# Patient Record
Sex: Female | Born: 1995 | Race: White | Hispanic: No | Marital: Single | Smoking: Current every day smoker
Health system: Southern US, Community
[De-identification: ages and names within clinical notes are randomized; demographics above are authoritative.]

## PROBLEM LIST (undated history)

## (undated) DIAGNOSIS — Z8489 Family history of other specified conditions: Secondary | ICD-10-CM

## (undated) DIAGNOSIS — J302 Other seasonal allergic rhinitis: Secondary | ICD-10-CM

## (undated) DIAGNOSIS — J45909 Unspecified asthma, uncomplicated: Secondary | ICD-10-CM

## (undated) DIAGNOSIS — R319 Hematuria, unspecified: Secondary | ICD-10-CM

## (undated) DIAGNOSIS — N200 Calculus of kidney: Secondary | ICD-10-CM

## (undated) DIAGNOSIS — N289 Disorder of kidney and ureter, unspecified: Secondary | ICD-10-CM

## (undated) DIAGNOSIS — R252 Cramp and spasm: Secondary | ICD-10-CM

## (undated) HISTORY — DX: Disorder of kidney and ureter, unspecified: N28.9

## (undated) HISTORY — PX: WISDOM TOOTH EXTRACTION: SHX21

## (undated) HISTORY — DX: Other seasonal allergic rhinitis: J30.2

## (undated) HISTORY — DX: Hematuria, unspecified: R31.9

## (undated) HISTORY — DX: Unspecified asthma, uncomplicated: J45.909

## (undated) HISTORY — DX: Cramp and spasm: R25.2

## (undated) HISTORY — DX: Calculus of kidney: N20.0

---

## 2017-06-12 ENCOUNTER — Encounter (HOSPITAL_COMMUNITY): Payer: Self-pay | Admitting: Emergency Medicine

## 2017-06-12 DIAGNOSIS — N939 Abnormal uterine and vaginal bleeding, unspecified: Secondary | ICD-10-CM | POA: Insufficient documentation

## 2017-06-12 DIAGNOSIS — F17210 Nicotine dependence, cigarettes, uncomplicated: Secondary | ICD-10-CM | POA: Insufficient documentation

## 2017-06-12 DIAGNOSIS — R102 Pelvic and perineal pain: Secondary | ICD-10-CM | POA: Insufficient documentation

## 2017-06-12 DIAGNOSIS — R109 Unspecified abdominal pain: Secondary | ICD-10-CM | POA: Diagnosis present

## 2017-06-12 LAB — CBC WITH DIFFERENTIAL/PLATELET
BASOS ABS: 0 10*3/uL (ref 0.0–0.1)
BASOS PCT: 0 %
EOS PCT: 1 %
Eosinophils Absolute: 0.1 10*3/uL (ref 0.0–0.7)
HCT: 43 % (ref 36.0–46.0)
Hemoglobin: 14.2 g/dL (ref 12.0–15.0)
LYMPHS PCT: 29 %
Lymphs Abs: 2.4 10*3/uL (ref 0.7–4.0)
MCH: 30.5 pg (ref 26.0–34.0)
MCHC: 33 g/dL (ref 30.0–36.0)
MCV: 92.3 fL (ref 78.0–100.0)
MONO ABS: 0.6 10*3/uL (ref 0.1–1.0)
MONOS PCT: 7 %
Neutro Abs: 5.2 10*3/uL (ref 1.7–7.7)
Neutrophils Relative %: 63 %
Platelets: 263 10*3/uL (ref 150–400)
RBC: 4.66 MIL/uL (ref 3.87–5.11)
RDW: 12.7 % (ref 11.5–15.5)
WBC: 8.3 10*3/uL (ref 4.0–10.5)

## 2017-06-12 LAB — URINALYSIS, ROUTINE W REFLEX MICROSCOPIC
BILIRUBIN URINE: NEGATIVE
Glucose, UA: NEGATIVE mg/dL
KETONES UR: NEGATIVE mg/dL
LEUKOCYTES UA: NEGATIVE
Nitrite: NEGATIVE
Protein, ur: NEGATIVE mg/dL
Specific Gravity, Urine: 1.008 (ref 1.005–1.030)
pH: 5 (ref 5.0–8.0)

## 2017-06-12 LAB — I-STAT BETA HCG BLOOD, ED (MC, WL, AP ONLY)

## 2017-06-12 LAB — BASIC METABOLIC PANEL
ANION GAP: 8 (ref 5–15)
BUN: 15 mg/dL (ref 6–20)
CALCIUM: 9.7 mg/dL (ref 8.9–10.3)
CO2: 22 mmol/L (ref 22–32)
CREATININE: 0.93 mg/dL (ref 0.44–1.00)
Chloride: 108 mmol/L (ref 101–111)
GFR calc Af Amer: >60 mL/min (ref >60)
GFR calc non Af Amer: >60 mL/min (ref >60)
GLUCOSE: 85 mg/dL (ref 65–99)
Potassium: 4 mmol/L (ref 3.5–5.1)
Sodium: 138 mmol/L (ref 135–145)

## 2017-06-12 NOTE — ED Triage Notes (Signed)
Pt reports two days of R sided flank pain with increasing pain over the last several days. Reports today that she started bleeding - dark blood, reports more than a period.

## 2017-06-13 ENCOUNTER — Emergency Department (HOSPITAL_COMMUNITY)

## 2017-06-13 ENCOUNTER — Emergency Department (HOSPITAL_COMMUNITY)
Admission: EM | Admit: 2017-06-13 | Discharge: 2017-06-13 | Disposition: A | Discharge status: Home / Self Care | Attending: Emergency Medicine | Admitting: Emergency Medicine

## 2017-06-13 DIAGNOSIS — R102 Pelvic and perineal pain: Secondary | ICD-10-CM

## 2017-06-13 DIAGNOSIS — N939 Abnormal uterine and vaginal bleeding, unspecified: Secondary | ICD-10-CM

## 2017-06-13 LAB — GC/CHLAMYDIA PROBE AMP (~~LOC~~) NOT AT ARMC
Chlamydia: NEGATIVE
NEISSERIA GONORRHEA: NEGATIVE

## 2017-06-13 LAB — WET PREP, GENITAL
CLUE CELLS WET PREP: NONE SEEN
Sperm: NONE SEEN
TRICH WET PREP: NONE SEEN
WBC, Wet Prep HPF POC: NONE SEEN
YEAST WET PREP: NONE SEEN

## 2017-06-13 MED ORDER — OXYCODONE-ACETAMINOPHEN 5-325 MG PO TABS
1.0000 | ORAL_TABLET | Freq: Once | ORAL | Status: AC
  Administered 2017-06-13: 1 via ORAL
  Filled 2017-06-13: qty 1

## 2017-06-13 NOTE — Discharge Instructions (Signed)
Recommend tylenol/motrin for pain. Follow-up with your OB-GYN on Feb 6th.  May be seen sooner if any new/acute changes.   Can also discuss with women's clinic if you feel you need sooner evaluation. Return to the ED for new or worsening symptoms.

## 2017-06-13 NOTE — ED Provider Notes (Signed)
[facility] EMERGENCY DEPARTMENT Provider Note   CSN: [id number] Arrival date & time: 06/12/17  2153     History   Chief Complaint Chief Complaint  Patient presents with  . Flank Pain  . Vaginal Bleeding    HPI Megan Brady is a 22 y.o. female.  The history is provided by the patient and medical records.  Flank Pain   Vaginal Bleeding  Primary symptoms include vaginal bleeding.    22 year old female here with right flank and pelvic pain.  States is been ongoing for about 24 hours but she began having abnormal, heavy vaginal bleeding this evening around 9:45 PM.  States bleeding was very heavy with passage of clots.  Reports she has had worsening lower abdominal cramping since this began.  She denies any fever or chills.  No nausea or vomiting.  States she did notice a small amount of discharge when she provided her urine sample in the waiting room.  She is currently sexually active with one female partner, denies any concern for STD at this time.  She has no GYN history.  She gets regular GYN exam exams, last in April 2018 which was normal.  States she did have history of heavy/irregular periods when she was younger, once being placed on Depo shots she usually no longer has a period.  Recently moved to [facility], seeing new OB-GYN on 06/28/17.  History reviewed. No pertinent past medical history.  There are no active problems to display for this patient.   Past Surgical History:  Procedure Laterality Date  . WISDOM TOOTH EXTRACTION      OB History    No data available       Home Medications    Prior to Admission medications   Not on File    Family History No family history on file.  Social History Social History   Tobacco Use  . Smoking status: Current Every Day Smoker    Packs/day: 0.50    Types: Cigarettes  . Smokeless tobacco: Never Used  Substance Use Topics  . Alcohol use: Yes    Frequency: Never  . Drug use: No     Allergies     Patient has no known allergies.   Review of Systems Review of Systems  Genitourinary: Positive for flank pain and vaginal bleeding.  All other systems reviewed and are negative.    Physical Exam Updated Vital Signs BP (!) 124/105 (BP Location: Right Arm)   Pulse (!) 106   Temp 98.5 F (36.9 C) (Oral)   Resp 20   Ht 5\' 5"  (1.651 m)   Wt 64.9 kg (143 lb)   LMP 03/23/2017 (Approximate)   SpO2 100%   BMI 23.80 kg/m   Physical Exam  Constitutional: She is oriented to person, place, and time. She appears well-developed and well-nourished.  HENT:  Head: Normocephalic and atraumatic.  Mouth/Throat: Oropharynx is clear and moist.  Eyes: Conjunctivae and EOM are normal. Pupils are equal, round, and reactive to light.  Neck: Normal range of motion.  Cardiovascular: Normal rate, regular rhythm and normal heart sounds.  Pulmonary/Chest: Effort normal and breath sounds normal. No stridor. No respiratory distress.  Abdominal: Soft. Bowel sounds are normal. There is no tenderness. There is no rebound and no CVA tenderness.    Genitourinary:  Genitourinary Comments: Exam chaperoned by NT Normal female external genitalia without visible lesions or rash; mild amount of vaginal bleeding; cervical os closed; no appreciable discharge; right adnexal tenderness; no CMT or friability;  no left adnexal tenderness  Musculoskeletal: Normal range of motion.  Neurological: She is alert and oriented to person, place, and time.  Skin: Skin is warm and dry.  Psychiatric: She has a normal mood and affect.  Nursing note and vitals reviewed.    ED Treatments / Results  Labs (all labs ordered are listed, but only abnormal results are displayed) Labs Reviewed  URINALYSIS, ROUTINE W REFLEX MICROSCOPIC - Abnormal; Notable for the following components:      Result Value   Color, Urine STRAW (*)    Hgb urine dipstick MODERATE (*)    Bacteria, UA RARE (*)    Squamous Epithelial / LPF 0-5 (*)    All  other components within normal limits  WET PREP, GENITAL  CBC WITH DIFFERENTIAL/PLATELET  BASIC METABOLIC PANEL  I-STAT BETA HCG BLOOD, ED ([facility], [facility], [facility] ONLY)  GC/CHLAMYDIA PROBE AMP ([place]) NOT AT [facility]    EKG  EKG Interpretation None       Radiology [place] Pelvic Doppler (torsion R/o Or Mass Arterial Flow)  Result Date: 06/13/2017 CLINICAL DATA:  Pelvic pain for 2 days, greater on the right. EXAM: TRANSABDOMINAL AND TRANSVAGINAL ULTRASOUND OF PELVIS DOPPLER ULTRASOUND OF OVARIES TECHNIQUE: Both transabdominal and transvaginal ultrasound examinations of the pelvis were performed. Transabdominal technique was performed for global imaging of the pelvis including uterus, ovaries, adnexal regions, and pelvic cul-de-sac. It was necessary to proceed with endovaginal exam following the transabdominal exam to visualize the uterus and ovaries. Color and duplex Doppler ultrasound was utilized to evaluate blood flow to the ovaries. COMPARISON:  None. FINDINGS: Uterus Measurements: 6.6 x 3.6 x 4 cm. Uterus is retroflexed. No fibroids or other mass visualized. Endometrium Thickness: 14 mm.  No focal abnormality visualized. Right ovary Measurements: 3.9 x 1.6 x 2.2 cm. Normal appearance/no adnexal mass. Left ovary Measurements: 3.5 x 1.8 x 2.2 cm. Normal appearance/no adnexal mass. Pulsed Doppler evaluation of both ovaries demonstrates normal low-resistance arterial and venous waveforms. Other findings There is a small amount of free fluid in the pelvis, likely physiologic. IMPRESSION: Normal ultrasound appearance of the uterus and ovaries. Small amount of free fluid in the pelvis, likely physiologic. Electronically Signed   By: Burman NievesWilliam  Stevens M.D.   On: 06/13/2017 02:22   [place] Pelvic Complete W Transvaginal And Torsion R/o  Result Date: 06/13/2017 CLINICAL DATA:  Pelvic pain for 2 days, greater on the right. EXAM: TRANSABDOMINAL AND TRANSVAGINAL ULTRASOUND OF PELVIS DOPPLER ULTRASOUND OF OVARIES TECHNIQUE:  Both transabdominal and transvaginal ultrasound examinations of the pelvis were performed. Transabdominal technique was performed for global imaging of the pelvis including uterus, ovaries, adnexal regions, and pelvic cul-de-sac. It was necessary to proceed with endovaginal exam following the transabdominal exam to visualize the uterus and ovaries. Color and duplex Doppler ultrasound was utilized to evaluate blood flow to the ovaries. COMPARISON:  None. FINDINGS: Uterus Measurements: 6.6 x 3.6 x 4 cm. Uterus is retroflexed. No fibroids or other mass visualized. Endometrium Thickness: 14 mm.  No focal abnormality visualized. Right ovary Measurements: 3.9 x 1.6 x 2.2 cm. Normal appearance/no adnexal mass. Left ovary Measurements: 3.5 x 1.8 x 2.2 cm. Normal appearance/no adnexal mass. Pulsed Doppler evaluation of both ovaries demonstrates normal low-resistance arterial and venous waveforms. Other findings There is a small amount of free fluid in the pelvis, likely physiologic. IMPRESSION: Normal ultrasound appearance of the uterus and ovaries. Small amount of free fluid in the pelvis, likely physiologic. Electronically Signed   By: Marisa CyphersWilliam  Stevens M.D.  On: 06/13/2017 02:22    Procedures Procedures (including critical care time)  Medications Ordered in ED Medications  oxyCODONE-acetaminophen (PERCOCET/ROXICET) 5-325 MG per tablet 1 tablet (1 tablet Oral Given 06/13/17 0128)     Initial Impression / Assessment and Plan / ED Course  I have reviewed the triage vital signs and the nursing notes.  Pertinent labs & imaging results that were available during my care of the patient were reviewed by me and considered in my medical decision making (see chart for details).  22 year old female here with right flank pain and heavy vaginal bleeding.  Currently on Depo-Provera injections and does not generally have a cycle.  States pain in right flank/pelvis.  No urinary symptoms or hematuria.  Did notice some  discharge when she arrived to the ED.  Denies concern for STD.  She is afebrile and nontoxic.  Some mild tenderness in the right pelvis. No focal CVA tenderness.  No hx of kidney stones. Screening labs overall reassuring.  UA with blood noted, however suspect this is vaginal given her presenting symptoms.  No signs of infection.  Pelvic performed, right adnexal tenderness noted.  No cervical motion tenderness or friability discussed with.  No appreciable discharge.  Will obtain pelvic ultrasound.  Pelvic ultrasound without acute findings.  Small amount of free fluid in the pelvis which is likely physiologic.  There are no masses or cysts.  Patient reports her bleeding has slowed significantly and her pain is improving, does still have some mild cramping.  We will have her follow-up closely with OB/GYN--has an appointment on 06/28/2017, she understands to follow-up sooner if any new or worsening symptoms.  Patient was discharged home in stable condition.  Final Clinical Impressions(s) / ED Diagnoses   Final diagnoses:  Pelvic pain  Vaginal bleeding    ED Discharge Orders    None       Garlon Hatchet, PA-C 06/13/17 0600    Horton, Mayer Masker, MD 06/13/17 [phone]

## 2017-06-13 NOTE — ED Notes (Signed)
Pt verbalizes understanding of d/c instructions. Pt ambulatory at d/c with all belongings.   

## 2017-06-13 NOTE — ED Notes (Signed)
Patient transported to Ultrasound 

## 2017-06-13 NOTE — ED Notes (Signed)
ED Provider at bedside. 

## 2017-08-23 ENCOUNTER — Encounter: Payer: Self-pay | Admitting: Primary Care

## 2017-08-23 ENCOUNTER — Ambulatory Visit (INDEPENDENT_AMBULATORY_CARE_PROVIDER_SITE_OTHER): Admitting: Primary Care

## 2017-08-23 VITALS — BP 122/78 | HR 90 | Temp 97.8°F | Ht 65.25 in | Wt 147.0 lb

## 2017-08-23 DIAGNOSIS — J4520 Mild intermittent asthma, uncomplicated: Secondary | ICD-10-CM | POA: Diagnosis not present

## 2017-08-23 DIAGNOSIS — J069 Acute upper respiratory infection, unspecified: Secondary | ICD-10-CM | POA: Diagnosis not present

## 2017-08-23 DIAGNOSIS — R311 Benign essential microscopic hematuria: Secondary | ICD-10-CM | POA: Diagnosis not present

## 2017-08-23 DIAGNOSIS — R319 Hematuria, unspecified: Secondary | ICD-10-CM | POA: Insufficient documentation

## 2017-08-23 MED ORDER — BENZONATATE 200 MG PO CAPS: 200.0000 mg | ORAL_CAPSULE | Freq: Three times a day (TID) | ORAL | 0 refills | Status: DC | PRN

## 2017-08-23 MED ORDER — ALBUTEROL SULFATE HFA 108 (90 BASE) MCG/ACT IN AERS: 2.0000 | INHALATION_SPRAY | Freq: Four times a day (QID) | RESPIRATORY_TRACT | 0 refills | Status: DC | PRN

## 2017-08-23 NOTE — Patient Instructions (Signed)
Your symptoms are representative of a viral illness which will resolve on its own over time. Our goal is to treat your symptoms in order to aid your body in the healing process and to make you more comfortable.   Continue taking the allergy medication.  You may take Benzonatate capsules for cough. Take 1 capsule by mouth three times daily as needed for cough.  Make sure to stay hydrated and rest.  Shortness of Breath/Wheezing/Cough: Use the albuterol inhaler. Inhale 2 puffs into the lungs every four to six hours as needed for wheezing, cough, and/or shortness of breath.   It was a pleasure to meet you today! Please don't hesitate to call or message me with any questions. Welcome to [facility]!

## 2017-08-23 NOTE — Assessment & Plan Note (Addendum)
Microscopic. Since childhood, evaluated by Urology in [place] and determined to be benign. Will obtain records.

## 2017-08-23 NOTE — Progress Notes (Signed)
Subjective:    Patient ID: Megan Brady, female    DOB: 12/06/1995, 22 y.o.   MRN: [id number]  HPI  Megan Brady is a 22 year old female who presents today to establish care and discuss the problems mentioned below. Will obtain old records.  1) Cough: She also reports sore throat, sinus pressure, rhinorrhea, watery eyes, post nasal drip. Her symptoms began 3-4 days ago. She's been taking an antihistamine and a "cold and flu" cough suppressant without much improvement. She denies fevers. She has been exposed to several children at work that have been sick. She did not get a flu shot this past year.   2) Form Completion: She has a form that needs to be completed for her current occupation as a Hydrographic surveyordaycare instructor.   3) Asthma: Diagnosed in childhood. She mostly doesn't experience shortness of breath or wheezing unless she's very physically active. She does not have an albuterol inhaler now, last use was in high school.   4) Kidney Stones: History of stones x 3 episodes in the past. She'll form stones if her sodium levels are quite high. Her last episode of renal stones was two years ago. 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 She was [place] in [place].   Review of Systems  Constitutional: Negative for fever.  HENT: Positive for congestion, rhinorrhea, sinus pressure and sore throat.   Respiratory: Positive for cough. Negative for shortness of breath.   Cardiovascular: Negative for chest pain.  Genitourinary: Negative for flank pain and hematuria.  Allergic/Immunologic: Positive for environmental allergies.       Past Medical History:  Diagnosis Date  . Asthma   . Hematuria   . Kidney stones   . Seasonal allergies      Social History   Socioeconomic History  . Marital status: Single    Spouse name: Not on file  . Number of children: Not on file  . Years of education: Not on file  . Highest education  level: Not on file  Occupational History  . Not on file  Social Needs  . Financial resource strain: Not on file  . Food insecurity:    Worry: Not on file    Inability: Not on file  . Transportation needs:    Medical: Not on file    Non-medical: Not on file  Tobacco Use  . Smoking status: Current Every Day Smoker    Packs/day: 0.50    Types: Cigarettes  . Smokeless tobacco: Never Used  Substance and Sexual Activity  . Alcohol use: Yes    Frequency: Never  . Drug use: No  . Sexual activity: Not on file  Lifestyle  . Physical activity:    Days per week: Not on file    Minutes per session: Not on file  . Stress: Not on file  Relationships  . Social connections:    Talks on phone: Not on file    Gets together: Not on file    Attends religious service: Not on file    Active member of club or organization: Not on file    Attends meetings of clubs or organizations: Not on file    Relationship status: Not on file  . Intimate partner violence:    Fear of current or ex partner: Not on file    Emotionally abused: Not on file    Physically abused: Not on file    Forced sexual activity: Not on file  Other Topics Concern  . Not on file  Social History Narrative   Married.   No children.   Works in Furniture conservator/restorer.   Enjoys reading, spending time outdoors.     Past Surgical History:  Procedure Laterality Date  . WISDOM TOOTH EXTRACTION      Family History  Problem Relation Age of Onset  . Hypertension Mother   . Hypothyroidism Mother   . Lung cancer Maternal Grandmother   . Lung cancer Maternal Aunt     No Known Allergies  No current outpatient medications on file prior to visit.   No current facility-administered medications on file prior to visit.     BP 122/78   Pulse 90   Temp 97.8 F (36.6 C) (Oral)   Ht 5' 5.25" (1.657 m)   Wt 147 lb (66.7 kg)   SpO2 99%   BMI 24.27 kg/m    Objective:   Physical Exam  Constitutional: She appears well-nourished.  HENT:   Right Ear: Tympanic membrane and ear canal normal.  Left Ear: Tympanic membrane and ear canal normal.  Nose: Right sinus exhibits no maxillary sinus tenderness and no frontal sinus tenderness. Left sinus exhibits no maxillary sinus tenderness and no frontal sinus tenderness.  Mouth/Throat: Oropharynx is clear and moist.  Eyes: Conjunctivae are normal.  Neck: Neck supple.  Cardiovascular: Normal rate and regular rhythm.  Pulmonary/Chest: Effort normal and breath sounds normal. She has no wheezes. She has no rales.  Lymphadenopathy:    She has no cervical adenopathy.  Skin: Skin is warm and dry.          Assessment & Plan:  URI:  Cough, rhinorrhea, sore throat, PND x 3 days. Exam today overall unremarkable, vitals WNL. Suspect viral vs allergy etiology, will treat with conservative measures. Rx for Jerilynn Som sent to pharmacy. Continue antihistamine. Fluids, rest, follow up PRN.  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 Patient is fit mentally and physically to proceed with employment.  Form signed.  Doreene Nest, NP

## 2017-08-23 NOTE — Assessment & Plan Note (Signed)
No recent asthma symptoms. Rx for albuterol inhaler sent to pharmacy to use PRN.

## 2017-12-04 ENCOUNTER — Ambulatory Visit (INDEPENDENT_AMBULATORY_CARE_PROVIDER_SITE_OTHER): Admitting: Primary Care

## 2017-12-04 ENCOUNTER — Encounter: Payer: Self-pay | Admitting: Primary Care

## 2017-12-04 VITALS — BP 122/74 | HR 103 | Temp 98.1°F | Ht 65.25 in | Wt 150.8 lb

## 2017-12-04 DIAGNOSIS — R197 Diarrhea, unspecified: Secondary | ICD-10-CM

## 2017-12-04 DIAGNOSIS — R1011 Right upper quadrant pain: Secondary | ICD-10-CM | POA: Diagnosis not present

## 2017-12-04 DIAGNOSIS — R109 Unspecified abdominal pain: Secondary | ICD-10-CM | POA: Diagnosis not present

## 2017-12-04 DIAGNOSIS — R110 Nausea: Secondary | ICD-10-CM

## 2017-12-04 NOTE — Patient Instructions (Signed)
Stop by the lab prior to leaving today. I will notify you of your results once received.   You will be contacted regarding your ultrasound  Please let us know if you have not been contacted within one week.   Start keeping a food journal of your symptoms. Take a look at the information below.  It was a pleasure to see you today!  Diet for Irritable Bowel Syndrome When you have irritable bowel syndrome (IBS), the foods you eat and your eating habits are very important. IBS may cause various symptoms, such as abdominal pain, constipation, or diarrhea. Choosing the right foods can help ease discomfort caused by these symptoms. Work with your health care provider and dietitian to find the best eating plan to help control your symptoms. What general guidelines do I need to follow?  Keep a food diary. This will help you identify foods that cause symptoms. Write down: ? What you eat and when. ? What symptoms you have. ? When symptoms occur in relation to your meals.  Avoid foods that cause symptoms. Talk with your dietitian about other ways to get the same nutrients that are in these foods.  Eat more foods that contain fiber. Take a fiber supplement if directed by your dietitian.  Eat your meals slowly, in a relaxed setting.  Aim to eat 5-6 small meals per day. Do not skip meals.  Drink enough fluids to keep your urine clear or pale yellow.  Ask your health care provider if you should take an over-the-counter probiotic during flare-ups to help restore healthy gut bacteria.  If you have cramping or diarrhea, try making your meals low in fat and high in carbohydrates. Examples of carbohydrates are pasta, rice, whole grain breads and cereals, fruits, and vegetables.  If dairy products cause your symptoms to flare up, try eating less of them. You might be able to handle yogurt better than other dairy products because it contains bacteria that help with digestion. What foods are not  recommended? The following are some foods and drinks that may worsen your symptoms:  Fatty foods, such as Jamaica[place] fries.  Milk products, such as cheese or ice cream.  Chocolate.  Alcohol.  Products with caffeine, such as coffee.  Carbonated drinks, such as soda.  The items listed above may not be a complete list of foods and beverages to avoid. Contact your dietitian for more information. What foods are good sources of fiber? Your health care provider or dietitian may recommend that you eat more foods that contain fiber. Fiber can help reduce constipation and other IBS symptoms. Add foods with fiber to your diet a little at a time so that your body can get used to them. Too much fiber at once might cause gas and swelling of your abdomen. The following are some foods that are good sources of fiber:  Apples.  Peaches.  Pears.  Berries.  Figs.  Broccoli (raw).  Cabbage.  Carrots.  Raw peas.  Kidney beans.  Lima beans.  Whole grain bread.  Whole grain cereal.  Where to find more information: [employer] for Functional Gastrointestinal Disorders: www.iffgd.[employer] National Institute of Diabetes and Digestive and Kidney Diseases: http://norris-lawson.com/www.niddk.nih.gov/health-information/health-topics/digestive-diseases/ibs/Pages/facts.aspx This information is not intended to replace advice given to you by your health care provider. Make sure you discuss any questions you have with your health care provider. Document Released: 07/30/2003 Document Revised: 10/15/2015 Document Reviewed: 08/09/2013 Elsevier Interactive Patient Education  2018 [employer]

## 2017-12-04 NOTE — Assessment & Plan Note (Signed)
Daily, most noticeable just after meals. Associated symptoms concerning for gall bladder involvement, IBS-Diarrhea type.  Given during of symptoms, location of symptoms, duration of symptoms, less likely differentials include renal stone, colitis.   Check CBC, CMP today. Check Ultrasound of RUQ to start. Consider renal ultrasound vs HIDA scan if imaging and labs inconclusive.   Consider dicyclomine for IBS treatment.

## 2017-12-04 NOTE — Progress Notes (Signed)
Subjective:    Patient ID: Megan Brady, female    DOB: March 16, 1996, 22 y.o.   MRN: [id number]  HPI  [name] is a 22 year old female with a history of kidney stone who presents today with a chief complaint of flank pain.  Her pain is located to the right flank with radiation to her lateral/right upper abdominal quadrant, sometimes to her right upper thoracic back. Her pain has been intermittent for the last 6 months, with daily symptoms of abdominal cramping, diarrhea, nausea, bloating.  Other symptoms include generalized abdominal cramping, diarrhea, nausea, abdominal bloating (without vomiting) that occurs mostly everyday when eating. She denies constipation, rectal bleeding, unexplained weight loss, vaginal symptoms, dysuria, urinary frequency, right lower quadrant pain, vomiting. She does experience diarrhea twice daily on average.   This does not feel like a kidney stone, her last kidney stone was in 2018. She makes sure to follow a specific diet to prevent kidney stones. Her mother had her gallbladder removed and thinks her daughter may have similar problems.   Review of Systems  Constitutional: Negative for fever.  Respiratory: Negative for shortness of breath.   Gastrointestinal: Positive for abdominal pain, diarrhea and nausea. Negative for blood in stool, constipation and vomiting.  Genitourinary: Positive for flank pain. Negative for dysuria, frequency, hematuria, pelvic pain and vaginal discharge.  Skin: Negative for color change.       Past Medical History:  Diagnosis Date  . Asthma   . Hematuria   . Kidney stones   . Seasonal allergies      Social History   Socioeconomic History  . Marital status: Single    Spouse name: Not on file  . Number of children: Not on file  . Years of education: Not on file  . Highest education level: Not on file  Occupational History  . Not on file  Social Needs  . Financial resource strain: Not on file  . Food insecurity:     Worry: Not on file    Inability: Not on file  . Transportation needs:    Medical: Not on file    Non-medical: Not on file  Tobacco Use  . Smoking status: Current Every Day Smoker    Packs/day: 0.50    Types: Cigarettes  . Smokeless tobacco: Never Used  Substance and Sexual Activity  . Alcohol use: Yes    Frequency: Never  . Drug use: No  . Sexual activity: Not on file  Lifestyle  . Physical activity:    Days per week: Not on file    Minutes per session: Not on file  . Stress: Not on file  Relationships  . Social connections:    Talks on phone: Not on file    Gets together: Not on file    Attends religious service: Not on file    Active member of club or organization: Not on file    Attends meetings of clubs or organizations: Not on file    Relationship status: Not on file  . Intimate partner violence:    Fear of current or ex partner: Not on file    Emotionally abused: Not on file    Physically abused: Not on file    Forced sexual activity: Not on file  Other Topics Concern  . Not on file  Social History Narrative   Married.   No children.   Works in Furniture conservator/restorer.   Enjoys reading, spending time outdoors.     Past Surgical History:  Procedure  Laterality Date  . WISDOM TOOTH EXTRACTION      Family History  Problem Relation Age of Onset  . Hypertension Mother   . Hypothyroidism Mother   . Lung cancer Maternal Grandmother   . Lung cancer Maternal Aunt     No Known Allergies  Current Outpatient Medications on File Prior to Visit  Medication Sig Dispense Refill  . albuterol (PROVENTIL HFA;VENTOLIN HFA) 108 (90 Base) MCG/ACT inhaler Inhale 2 puffs into the lungs every 6 (six) hours as needed. 1 Inhaler 0  . ondansetron (ZOFRAN-ODT) 4 MG disintegrating tablet Take 4 mg by mouth every 8 (eight) hours as needed.     No current facility-administered medications on file prior to visit.     BP 122/74   Pulse (!) 103   Temp 98.1 F (36.7 C) (Oral)   Ht 5'  5.25" (1.657 m)   Wt 150 lb 12 oz (68.4 kg)   SpO2 98%   BMI 24.89 kg/m    Objective:   Physical Exam  Constitutional: She appears well-nourished.  Neck: Neck supple.  Cardiovascular: Normal rate and regular rhythm.  Respiratory: Breath sounds normal.  GI: Soft. Normal appearance and bowel sounds are normal. There is no tenderness. There is no guarding, no CVA tenderness, no tenderness at McBurney's point and negative Murphy's sign.  Skin: Skin is warm and dry.           Assessment & Plan:

## 2017-12-05 ENCOUNTER — Telehealth: Payer: Self-pay

## 2017-12-05 LAB — COMPREHENSIVE METABOLIC PANEL
ALBUMIN: 4.5 g/dL (ref 3.5–5.2)
ALT: 25 U/L (ref 0–35)
AST: 20 U/L (ref 0–37)
Alkaline Phosphatase: 59 U/L (ref 39–117)
BUN: 10 mg/dL (ref 6–23)
CHLORIDE: 108 meq/L (ref 96–112)
CO2: 28 mEq/L (ref 19–32)
CREATININE: 1.08 mg/dL (ref 0.40–1.20)
Calcium: 9.9 mg/dL (ref 8.4–10.5)
GFR: 67.69 mL/min (ref >60.00)
GLUCOSE: 97 mg/dL (ref 70–99)
POTASSIUM: 4.9 meq/L (ref 3.5–5.1)
SODIUM: 142 meq/L (ref 135–145)
Total Bilirubin: 0.8 mg/dL (ref 0.2–1.2)
Total Protein: 7.4 g/dL (ref 6.0–8.3)

## 2017-12-05 LAB — CBC
HEMATOCRIT: 41.2 % (ref 36.0–46.0)
Hemoglobin: 14.2 g/dL (ref 12.0–15.0)
MCHC: 34.5 g/dL (ref 30.0–36.0)
MCV: 91 fl (ref 78.0–100.0)
Platelets: 258 10*3/uL (ref 150.0–400.0)
RBC: 4.53 Mil/uL (ref 3.87–5.11)
RDW: 12.2 % (ref 11.5–15.5)
WBC: 6.3 10*3/uL (ref 4.0–10.5)

## 2017-12-05 NOTE — Telephone Encounter (Signed)
Left message for patient to call Deavon Podgorski back in regards to an ultrasound appointment-Lamberto Dinapoli V Iyari Hagner, RMA

## 2018-04-23 ENCOUNTER — Ambulatory Visit
Admission: RE | Admit: 2018-04-23 | Discharge: 2018-04-23 | Disposition: A | Discharge status: Home / Self Care | Source: Ambulatory Visit | Attending: Primary Care | Admitting: Primary Care

## 2018-04-23 DIAGNOSIS — K838 Other specified diseases of biliary tract: Secondary | ICD-10-CM | POA: Diagnosis not present

## 2018-04-23 DIAGNOSIS — R197 Diarrhea, unspecified: Secondary | ICD-10-CM | POA: Insufficient documentation

## 2018-04-23 DIAGNOSIS — R110 Nausea: Secondary | ICD-10-CM

## 2018-04-23 DIAGNOSIS — R1011 Right upper quadrant pain: Secondary | ICD-10-CM | POA: Diagnosis not present

## 2018-04-23 DIAGNOSIS — K8020 Calculus of gallbladder without cholecystitis without obstruction: Secondary | ICD-10-CM | POA: Insufficient documentation

## 2018-04-26 ENCOUNTER — Telehealth: Payer: Self-pay | Admitting: *Deleted

## 2018-04-26 NOTE — Telephone Encounter (Signed)
Please call pt [phone]

## 2018-04-26 NOTE — Telephone Encounter (Signed)
-----   Message from Doreene NestKatherine K Clark, NP sent at 04/23/2018  1:43 PM EST ----- Please notify patient:  The ultrasound of her abdomen shows gallstones without inflammation to the gallbladder. If she still experiencing symptoms of abdominal pain, diarrhea, nausea, bloating?  If so then it would be reasonable to get her to the lab to repeat her liver enzymes. Let me know.

## 2018-04-26 NOTE — Telephone Encounter (Signed)
Pt returning call for results

## 2018-04-26 NOTE — Telephone Encounter (Signed)
Message left for patient to return my call.  

## 2018-04-27 ENCOUNTER — Other Ambulatory Visit: Payer: Self-pay | Admitting: Primary Care

## 2018-04-27 ENCOUNTER — Other Ambulatory Visit (INDEPENDENT_AMBULATORY_CARE_PROVIDER_SITE_OTHER)

## 2018-04-27 DIAGNOSIS — R109 Unspecified abdominal pain: Secondary | ICD-10-CM

## 2018-04-27 NOTE — Telephone Encounter (Signed)
Spoken and notified patient of Graylon GunningKate Clark's comments. Patient verbalized understanding.  Addressed in result note

## 2018-04-28 LAB — HEPATIC FUNCTION PANEL
AG Ratio: 1.7 (calc) (ref 1.0–2.5)
ALKALINE PHOSPHATASE (APISO): 63 U/L (ref 33–115)
ALT: 23 U/L (ref 6–29)
AST: 18 U/L (ref 10–30)
Albumin: 4.5 g/dL (ref 3.6–5.1)
BILIRUBIN INDIRECT: 1 mg/dL (ref 0.2–1.2)
BILIRUBIN TOTAL: 1.4 mg/dL — AB (ref 0.2–1.2)
Bilirubin, Direct: 0.4 mg/dL — ABNORMAL HIGH (ref 0.0–0.2)
Globulin: 2.6 g/dL (calc) (ref 1.9–3.7)
TOTAL PROTEIN: 7.1 g/dL (ref 6.1–8.1)

## 2018-04-30 ENCOUNTER — Telehealth: Payer: Self-pay | Admitting: Primary Care

## 2018-04-30 NOTE — Telephone Encounter (Signed)
Patient is calling requesting her lab results.  Please call patient.

## 2018-04-30 NOTE — Telephone Encounter (Signed)
Spoken and notified patient of Megan Brady's comments. Patient verbalized understanding.  

## 2018-05-02 ENCOUNTER — Ambulatory Visit (INDEPENDENT_AMBULATORY_CARE_PROVIDER_SITE_OTHER): Admitting: Primary Care

## 2018-05-02 ENCOUNTER — Encounter: Payer: Self-pay | Admitting: Primary Care

## 2018-05-02 VITALS — BP 130/84 | HR 107 | Temp 97.9°F | Ht 65.25 in | Wt 150.2 lb

## 2018-05-02 DIAGNOSIS — K219 Gastro-esophageal reflux disease without esophagitis: Secondary | ICD-10-CM | POA: Diagnosis not present

## 2018-05-02 DIAGNOSIS — R109 Unspecified abdominal pain: Secondary | ICD-10-CM | POA: Diagnosis not present

## 2018-05-02 MED ORDER — PANTOPRAZOLE SODIUM 40 MG PO TBEC: 40.0000 mg | DELAYED_RELEASE_TABLET | Freq: Every day | ORAL | 0 refills | Status: DC

## 2018-05-02 MED ORDER — ONDANSETRON 4 MG PO TBDP: 4.0000 mg | ORAL_TABLET | Freq: Three times a day (TID) | ORAL | 0 refills | Status: DC | PRN

## 2018-05-02 NOTE — Assessment & Plan Note (Signed)
Uncontrolled on omeprazole 20 mg and hasn't found improvement with 40 mg dose.  Rx for pantoprazole 40 mg sent to pharmacy for her to trial. She will update in 2 weeks.   Referral placed to GI.

## 2018-05-02 NOTE — Assessment & Plan Note (Signed)
Chronic for nearly 1 year, no improvement. Recent ultrasound with cholelithiasis without cholecystitis. Also some uncontrolled GERD for which we will aggressively treat today. Referral placed to GI for further evaluation given chronicity of symptoms. Discussed to avoid fried/fatty/irritative foods.

## 2018-05-02 NOTE — Progress Notes (Signed)
Subjective:    Patient ID: Megan Brady, female    DOB: [birth year], 22 y.o.   MRN: [id number]  HPI  Megan Brady is a 22 year old female who presents today to discuss chronic abdominal cramping.  She was last evaluated in July 2019 with complaints of right flank pain with radiation to her lateral/right upper abdominal quadrant, sometimes to her right upper thoracic back. She endorsed other chronic generalized abdominal pain for 6 months with symptoms including abdominal cramping, diarrhea, nausea, bloating. Symptoms occurred nearly everyday with meals. Her lab work up was unremarkable so an abdominal ultrasound was ordered.  She completed the abdominal ultrasound on 04/23/18 which showed several <1 cm gall stones with mild bilary ductal dilation. Her liver enzymes were rechecked and were unremarkable.   Since her last visit she continues to experience right side pain that will radiate around to her RUQ. She has diarrhea daily, 3-4 times daily with and without regards to eating. Her pain is daily, worse after eating, improved (not resolved) within 30-45 minutes. She does feel nauseated, will take Zofran to prevent herself from vomiting. She's had three episodes of severe pain where she can't move. She's taking Advil, Tylenol, heating pads without improvement. She has noticed certain foods will increase her symptoms including red sauce, lettuce, pizza, fast food.   She does have a history of GERD and takes omeprazole 20 mg most everyday and does have breakthrough symptoms of esophageal burning, throat fullness, belching. She has taken several of her boyfriend's mother's omeprazole 40 mg and doesn't notice much of a difference or improvement in symptoms.    Diet currently consists of:  Breakfast: Skips Lunch: Noodles, fast food Dinner: Chicken, steak, vegetables, starch Snacks: None Desserts: 4-5 days weekly, cookies Beverages: Soda, juice, Gatorade, sometimes water, alcohol  infrequently.   Review of Systems  Constitutional: Negative for fever.  Gastrointestinal: Positive for abdominal pain, diarrhea and nausea. Negative for vomiting.  Skin: Negative for color change.       Past Medical History:  Diagnosis Date  . Asthma   . Hematuria   . Kidney stones   . Seasonal allergies      Social History   Socioeconomic History  . Marital status: Single    Spouse name: Not on file  . Number of children: Not on file  . Years of education: Not on file  . Highest education level: Not on file  Occupational History  . Not on file  Social Needs  . Financial resource strain: Not on file  . Food insecurity:    Worry: Not on file    Inability: Not on file  . Transportation needs:    Medical: Not on file    Non-medical: Not on file  Tobacco Use  . Smoking status: Current Every Day Smoker    Packs/day: 0.50    Types: Cigarettes  . Smokeless tobacco: Never Used  Substance and Sexual Activity  . Alcohol use: Yes    Frequency: Never  . Drug use: No  . Sexual activity: Not on file  Lifestyle  . Physical activity:    Days per week: Not on file    Minutes per session: Not on file  . Stress: Not on file  Relationships  . Social connections:    Talks on phone: Not on file    Gets together: Not on file    Attends religious service: Not on file    Active member of club or organization: Not on file  Attends meetings of clubs or organizations: Not on file    Relationship status: Not on file  . Intimate partner violence:    Fear of current or ex partner: Not on file    Emotionally abused: Not on file    Physically abused: Not on file    Forced sexual activity: Not on file  Other Topics Concern  . Not on file  Social History Narrative   Married.   No children.   Works in Furniture conservator/restorerchildcare.   Enjoys reading, spending time outdoors.     Past Surgical History:  Procedure Laterality Date  . WISDOM TOOTH EXTRACTION      Family History  Problem Relation  Age of Onset  . Hypertension Mother   . Hypothyroidism Mother   . Lung cancer Maternal Grandmother   . Lung cancer Maternal Aunt     No Known Allergies  Current Outpatient Medications on File Prior to Visit  Medication Sig Dispense Refill  . ondansetron (ZOFRAN-ODT) 4 MG disintegrating tablet Take 4 mg by mouth every 8 (eight) hours as needed.    Marland Kitchen. albuterol (PROVENTIL HFA;VENTOLIN HFA) 108 (90 Base) MCG/ACT inhaler Inhale 2 puffs into the lungs every 6 (six) hours as needed. (Patient not taking: Reported on 05/02/2018) 1 Inhaler 0  . medroxyPROGESTERone (DEPO-PROVERA) 150 MG/ML injection INJ 1 ML UTD Q 11 TO 12 WEEKS  4   No current facility-administered medications on file prior to visit.     BP 130/84   Pulse (!) 107   Temp 97.9 F (36.6 C) (Oral)   Ht 5' 5.25" (1.657 m)   Wt 150 lb 4 oz (68.2 kg)   SpO2 98%   BMI 24.81 kg/m    Objective:   Physical Exam  Constitutional: She appears well-nourished.  Neck: Neck supple.  Cardiovascular: Normal rate and regular rhythm.  Respiratory: Effort normal and breath sounds normal.  GI: Soft. Bowel sounds are normal. There is no tenderness. There is no guarding, no CVA tenderness, no tenderness at McBurney's point and negative Murphy's sign.  Skin: Skin is warm and dry.  Psychiatric: She has a normal mood and affect.           Assessment & Plan:

## 2018-05-02 NOTE — Patient Instructions (Addendum)
Stop taking omeprazole for heartburn. Start taking pantoprazole 40 mg daily for heartburn.  Stop by the front desk and speak with either Shirlee LimerickMarion or Anastasiya regarding your referral to GI.  Please call me with an update in your heartburn symptoms in 2 weeks.  It was a pleasure to see you today!

## 2018-05-04 ENCOUNTER — Encounter: Payer: Self-pay | Admitting: Gastroenterology

## 2018-05-04 ENCOUNTER — Other Ambulatory Visit (INDEPENDENT_AMBULATORY_CARE_PROVIDER_SITE_OTHER)

## 2018-05-04 ENCOUNTER — Other Ambulatory Visit

## 2018-05-04 ENCOUNTER — Ambulatory Visit (INDEPENDENT_AMBULATORY_CARE_PROVIDER_SITE_OTHER): Admitting: Gastroenterology

## 2018-05-04 VITALS — Ht 66.0 in | Wt 149.0 lb

## 2018-05-04 DIAGNOSIS — K838 Other specified diseases of biliary tract: Secondary | ICD-10-CM

## 2018-05-04 DIAGNOSIS — R7989 Other specified abnormal findings of blood chemistry: Secondary | ICD-10-CM

## 2018-05-04 DIAGNOSIS — K529 Noninfective gastroenteritis and colitis, unspecified: Secondary | ICD-10-CM

## 2018-05-04 DIAGNOSIS — R945 Abnormal results of liver function studies: Secondary | ICD-10-CM

## 2018-05-04 DIAGNOSIS — R1011 Right upper quadrant pain: Secondary | ICD-10-CM

## 2018-05-04 DIAGNOSIS — R9389 Abnormal findings on diagnostic imaging of other specified body structures: Secondary | ICD-10-CM

## 2018-05-04 LAB — HEPATIC FUNCTION PANEL
ALK PHOS: 60 U/L (ref 39–117)
ALT: 23 U/L (ref 0–35)
AST: 18 U/L (ref 0–37)
Albumin: 4.7 g/dL (ref 3.5–5.2)
Bilirubin, Direct: 0.2 mg/dL (ref 0.0–0.3)
Total Bilirubin: 1.1 mg/dL (ref 0.2–1.2)
Total Protein: 7.7 g/dL (ref 6.0–8.3)

## 2018-05-04 LAB — LIPASE: Lipase: 15 U/L (ref 11.0–59.0)

## 2018-05-04 LAB — IGA: IgA: 195 mg/dL (ref 68–378)

## 2018-05-04 LAB — HIGH SENSITIVITY CRP: CRP, High Sensitivity: 1.84 mg/L (ref 0.000–5.000)

## 2018-05-04 LAB — AMYLASE: Amylase: 41 U/L (ref 27–131)

## 2018-05-04 LAB — TSH: TSH: 0.9 u[IU]/mL (ref 0.35–4.50)

## 2018-05-04 LAB — CORTISOL: Cortisol, Plasma: 4.8 ug/dL

## 2018-05-04 NOTE — Patient Instructions (Addendum)
You have been scheduled for an MRI at [facility] on 05/09/18. Your appointment time is 8pm. Please arrive 15 minutes prior to your appointment time for registration purposes. Please make certain not to have anything to eat or drink 6 hours prior to your test. In addition, if you have any metal in your body, have a pacemaker or defibrillator, please be sure to let your ordering physician know. This test typically takes 45 minutes to 1 hour to complete. Should you need to reschedule, please call [phone] to do so.  Your provider has requested that you go to the basement level for lab work before leaving today. Press "B" on the elevator. The lab is located at the first door on the left as you exit the elevator.  Thank you for entrusting me with your care and choosing [facility].  Dr Meridee ScoreMansouraty

## 2018-05-04 NOTE — Progress Notes (Signed)
GASTROENTEROLOGY OUTPATIENT CLINIC VISIT   Primary Care Provider Doreene Nest, NP [address] [place] [address] [phone]  Referring Provider Doreene Nest, NP [address] [place], [place] [address] [phone]  Patient Profile: Megan Brady is a 22 y.o. female with a pmh significant for asthma, nephrolithiasis, seasonal allergies.  The patient presents to the [facility] for an evaluation and management of problem(s) noted below:  Problem List 1. RUQ abdominal pain   2. Abnormal LFTs   3. Chronic diarrhea   4. Dilated bile duct   5. Abnormal ultrasound     History of Present Illness: This is a patient first visit to the outpatient of our GI clinic.  She states for the last year to year and a half she has had a significant abdominal pain.  This occurs in her right upper quadrant region and right flank.  Initially when things began she was concerned whether this could be recurrent kidney stones because she has had a history of kidney stones as a child.  The patient states the pain is sharp, stabbing, deep.  It causes her at times to be in tears the discomfort usually comes about within 10 to 15 minutes of eating but can occur at other times when she has.  She does not associate particular types of foods to causing her pain to be more significant or alleviated.  She does have a longstanding issues of chronic diarrhea.  She describes having a bowel movement at least 3-4 times per day which is loose and watery at times.  Very infrequently is formed completely per her report.  She has never noticed a stool to float.  She does not remember it being ever being clay in color.  She notices no issues of melena or hematochezia.  She has a relatively increased gastrocolic reflex and is using the restroom within an hour of eating.  Because of how often she uses the restroom she does get some anal discomfort because of the frequent toilet paper use.  The  patient has trialed for her abdominal discomforts Motrin as well as Advil as well as ibuprofen as well as Tylenol with very minimal effects.  She describes having a period of time when she thought she had darkened urine for short period in time but has never had overt jaundice.  Before the recent laboratory test she had never been told that she had abnormal liver testing.  There is no family history of liver disease or pancreatic disease.  No family history of gallbladder disease.  She states that she has heartburn a few times per week.  She has never had an upper or lower endoscopy.  GI Review of Systems Positive as above including increased eructation, bloating, increased flatulence, loss of appetite, nausea, vomiting Negative for hematemesis, jaundice, coffee-ground emesis, fecal incontinence, fecal urgency  Review of Systems General: Positive for weight gain; denies fevers/chills HEENT: Denies oral lesions Cardiovascular: Denies chest pain Pulmonary: Denies shortness of breath Gastroenterological: See HPI Genitourinary: Denies current darkened urine or hematuria Hematological: Denies easy bruising/bleeding Endocrine: Denies temperature intolerance Dermatological: Denies skin changes Psychological: Mood is stable Allergy & Immunology: Denies severe allergic reactions Musculoskeletal: Denies new arthralgias   Medications Current Outpatient Medications  Medication Sig Dispense Refill  . albuterol (PROVENTIL HFA;VENTOLIN HFA) 108 (90 Base) MCG/ACT inhaler Inhale 2 puffs into the lungs every 6 (six) hours as needed. 1 Inhaler 0  . medroxyPROGESTERone (DEPO-PROVERA) 150 MG/ML injection INJ 1 ML  UTD Q 11 TO 12 WEEKS  4  . ondansetron (ZOFRAN-ODT) 4 MG disintegrating tablet Take 1 tablet (4 mg total) by mouth every 8 (eight) hours as needed for nausea. 20 tablet 0  . pantoprazole (PROTONIX) 40 MG tablet Take 1 tablet (40 mg total) by mouth daily. For heartburn. 30 tablet 0   No current  facility-administered medications for this visit.     Allergies No Known Allergies  Histories Past Medical History:  Diagnosis Date  . Asthma   . Exercise-induced cramps of lower extremity    exercise induced compartment syndrome  . Hematuria   . Kidney disease    as a child  . Kidney stones   . Seasonal allergies    Past Surgical History:  Procedure Laterality Date  . WISDOM TOOTH EXTRACTION     Social History   Socioeconomic History  . Marital status: Single    Spouse name: Not on file  . Number of children: Not on file  . Years of education: Not on file  . Highest education level: Not on file  Occupational History  . Not on file  Social Needs  . Financial resource strain: Not on file  . Food insecurity:    Worry: Not on file    Inability: Not on file  . Transportation needs:    Medical: Not on file    Non-medical: Not on file  Tobacco Use  . Smoking status: Current Every Day Smoker    Packs/day: 0.50    Types: Cigarettes  . Smokeless tobacco: Never Used  Substance and Sexual Activity  . Alcohol use: Yes    Frequency: Never    Comment: seldom  . Drug use: No  . Sexual activity: Yes  Lifestyle  . Physical activity:    Days per week: Not on file    Minutes per session: Not on file  . Stress: Not on file  Relationships  . Social connections:    Talks on phone: Not on file    Gets together: Not on file    Attends religious service: Not on file    Active member of club or organization: Not on file    Attends meetings of clubs or organizations: Not on file    Relationship status: Not on file  . Intimate partner violence:    Fear of current or ex partner: Not on file    Emotionally abused: Not on file    Physically abused: Not on file    Forced sexual activity: Not on file  Other Topics Concern  . Not on file  Social History Narrative   Married.   No children.   Works in Furniture conservator/restorer.   Enjoys reading, spending time outdoors.    Family History    Problem Relation Age of Onset  . Hypertension Mother   . Hypothyroidism Mother   . Lung cancer Maternal Grandmother   . Lung cancer Maternal Aunt   . Diabetes Paternal Grandfather   . Heart disease Paternal Grandfather   . Colon cancer Neg Hx   . Stomach cancer Neg Hx   . AAA (abdominal aortic aneurysm) Neg Hx   . Esophageal cancer Neg Hx   . Inflammatory bowel disease Neg Hx   . Liver disease Neg Hx   . Pancreatic cancer Neg Hx   . Rectal cancer Neg Hx    I have reviewed her medical, social, and family history in detail and updated the electronic medical record as necessary.    PHYSICAL EXAMINATION  Ht 5\' 6"  (1.676 m)   Wt 149 lb (67.6 kg)   LMP  (Approximate) Comment: december 2018  BMI 24.05 kg/m  Wt Readings from Last 3 Encounters:  05/04/18 149 lb (67.6 kg)  05/02/18 150 lb 4 oz (68.2 kg)  12/04/17 150 lb 12 oz (68.4 kg)  GEN: NAD, appears stated age, doesn't appear chronically ill, accompanied by boyfriend PSYCH: Cooperative, without pressured speech EYE: Conjunctivae pink, sclerae anicteric ENT: MMM, without oral ulcers, no erythema or exudates noted NECK: Supple CV: RR without R/Gs  RESP: CTAB posteriorly, without wheezing GI: NABS, soft, NT/ND, without rebound or guarding, no HSM appreciated MSK/EXT: No lower extremity edema SKIN: No jaundice NEURO:  Alert & Oriented x 3, no focal deficits   REVIEW OF DATA  I reviewed the following data at the time of this encounter:  GI Procedures and Studies  No relevant studies to review  Laboratory Studies  Reviewed in epic  Imaging Studies  December 2019 ultrasound right upper quadrant IMPRESSION: Cholelithiasis.  No sonographic evidence of cholecystitis.  Mild biliary ductal dilatation, with common bile duct measuring 7 mm. Recommend correlation with liver function tests. If abnormal, consider further evaluation with abdomen MRI and MRCP without and with contrast.   ASSESSMENT  Ms. Barra is a 22 y.o.  female with a pmh significant for The patient is seen today for evaluation and management of:  1. RUQ abdominal pain   2. Abnormal LFTs   3. Chronic diarrhea   4. Dilated bile duct   5. Abnormal ultrasound    The patient is clinically and hemodynamically stable at this point in time.  The etiology of her symptoms is not typical biliary colic however in the setting of a slightly dilated CBD with cholelithiasis and an abnormal elevated bilirubin I think it is reasonable to pursue an MRI/MRCP to evaluate and ensure she does not have choledocholithiasis that would require an ERCP and then subsequently absolute indication for cholecystectomy.  If her CBD is much smaller than the 7 mm as noted previously and may be she had a passed stone and would be more information for [place] to consider the role of a cholecystectomy at some point in time in the future.  We will rule out H. pylori and then put her on twice daily PPI therapy.  We will consider the role of diagnostic upper and lower endoscopy for the work-up of her chronic diarrhea which based on a relatively quick gastrocolic reflex is most suggestive of a functional diarrhea.  All patient questions were answered, to the best of my ability, and the patient agrees to the aforementioned plan of action with follow-up as indicated.   PLAN  Laboratories as outlined below MRI/MRCP to evaluate dilated bile duct and slight abnormal liver biochemical testing on recent imaging of the ultrasound as well as liver tests Consideration of surgical referral for consideration of cholecystectomy may be considered in the future After H. pylori's stool study has been obtained then increase PPI to 40 mg twice daily Stool studies as outlined below to rule out infectious etiologies (very unlikely based on chronicity) We will consider the role of diagnostic upper and lower endoscopies based on the work-up below  Orders Placed This Encounter  Procedures  . Stool culture  . Ova  and parasite examination  . Helicobacter pylori special antigen  . MR ABDOMEN MRCP W WO CONTAST  . Amylase  . Lipase  . Hepatic function panel  . TSH  . Cortisol  .  High sensitivity CRP  . Tissue transglutaminase, IgA  . IgA  . Clostridium difficile Toxin B, Qualitative, Real-Time PCR  . Fecal Lactoferrin    New Prescriptions   No medications on file   Modified Medications   No medications on file    Planned Follow Up: No follow-ups on file.   Corliss ParishGabriel Mansouraty, MD [facility] Office # [phone]

## 2018-05-05 ENCOUNTER — Encounter: Payer: Self-pay | Admitting: Gastroenterology

## 2018-05-05 DIAGNOSIS — R945 Abnormal results of liver function studies: Secondary | ICD-10-CM | POA: Insufficient documentation

## 2018-05-05 DIAGNOSIS — R9389 Abnormal findings on diagnostic imaging of other specified body structures: Secondary | ICD-10-CM | POA: Insufficient documentation

## 2018-05-05 DIAGNOSIS — R1011 Right upper quadrant pain: Secondary | ICD-10-CM | POA: Insufficient documentation

## 2018-05-05 DIAGNOSIS — R7989 Other specified abnormal findings of blood chemistry: Secondary | ICD-10-CM | POA: Insufficient documentation

## 2018-05-05 DIAGNOSIS — K529 Noninfective gastroenteritis and colitis, unspecified: Secondary | ICD-10-CM | POA: Insufficient documentation

## 2018-05-05 DIAGNOSIS — K838 Other specified diseases of biliary tract: Secondary | ICD-10-CM | POA: Insufficient documentation

## 2018-05-07 LAB — TISSUE TRANSGLUTAMINASE, IGA: (tTG) Ab, IgA: 1 U/mL

## 2018-05-09 ENCOUNTER — Other Ambulatory Visit: Payer: Self-pay | Admitting: Gastroenterology

## 2018-05-09 ENCOUNTER — Ambulatory Visit (HOSPITAL_COMMUNITY)
Admission: RE | Admit: 2018-05-09 | Discharge: 2018-05-09 | Disposition: A | Discharge status: Home / Self Care | Source: Ambulatory Visit | Attending: Gastroenterology | Admitting: Gastroenterology

## 2018-05-09 DIAGNOSIS — K838 Other specified diseases of biliary tract: Secondary | ICD-10-CM

## 2018-05-09 DIAGNOSIS — K8020 Calculus of gallbladder without cholecystitis without obstruction: Secondary | ICD-10-CM | POA: Diagnosis not present

## 2018-05-09 DIAGNOSIS — R945 Abnormal results of liver function studies: Secondary | ICD-10-CM | POA: Diagnosis present

## 2018-05-09 DIAGNOSIS — R7989 Other specified abnormal findings of blood chemistry: Secondary | ICD-10-CM

## 2018-05-09 MED ORDER — GADOBUTROL 1 MMOL/ML IV SOLN
7.0000 mL | Freq: Once | INTRAVENOUS | Status: AC | PRN
  Administered 2018-05-09: 6 mL via INTRAVENOUS

## 2018-05-10 ENCOUNTER — Other Ambulatory Visit

## 2018-05-10 DIAGNOSIS — R7989 Other specified abnormal findings of blood chemistry: Secondary | ICD-10-CM

## 2018-05-10 DIAGNOSIS — K838 Other specified diseases of biliary tract: Secondary | ICD-10-CM

## 2018-05-10 DIAGNOSIS — R945 Abnormal results of liver function studies: Secondary | ICD-10-CM

## 2018-05-10 DIAGNOSIS — K529 Noninfective gastroenteritis and colitis, unspecified: Secondary | ICD-10-CM

## 2018-05-10 DIAGNOSIS — R1011 Right upper quadrant pain: Secondary | ICD-10-CM

## 2018-05-10 LAB — POCT I-STAT CREATININE: Creatinine, Ser: 1.1 mg/dL — ABNORMAL HIGH (ref 0.44–1.00)

## 2018-05-18 LAB — STOOL CULTURE
MICRO NUMBER:: 91520725
MICRO NUMBER:: 91520726
MICRO NUMBER:: 91520728
SHIGA RESULT: NOT DETECTED
SPECIMEN QUALITY: ADEQUATE
SPECIMEN QUALITY:: ADEQUATE
SPECIMEN QUALITY:: ADEQUATE

## 2018-05-18 LAB — OVA AND PARASITE EXAMINATION
CONCENTRATE RESULT:: NONE SEEN
MICRO NUMBER:: 91520727
SPECIMEN QUALITY:: ADEQUATE
TRICHROME RESULT: NONE SEEN

## 2018-05-31 ENCOUNTER — Ambulatory Visit: Admitting: Gastroenterology

## 2018-12-11 ENCOUNTER — Telehealth: Payer: Self-pay | Admitting: Primary Care

## 2018-12-11 NOTE — Telephone Encounter (Signed)
Agree with [employer] recommendation.

## 2018-12-11 NOTE — Telephone Encounter (Signed)
Spoken to patient. Inform that Megan Brady is out of the office and that I cannot promise that another will be able to addressed this by 4pm.  I have inform her that she should call [employer] call center at [phone] since she works at [employer].   FYI. Megan Brady

## 2018-12-11 NOTE — Telephone Encounter (Signed)
Patient works at [employer].  Patient had an exposure 10 days ago to her father who tested positive for Covid 19. Patient's boyfriend has symptoms for Covid and he was tested yesterday and hasn't received the results  Patient has been told by the health department she should Quarantine until test results for her boyfriend come back.  Patient doesn't have symptoms. Patient needs opinion if she needs to [facility]. Work said if patient needs to [place], she needs a note stating she needs to [place], otherwise she can go back to work. Patient has to know by 4:00 for work.

## 2019-01-10 ENCOUNTER — Encounter: Payer: Self-pay | Admitting: Primary Care

## 2019-01-10 ENCOUNTER — Ambulatory Visit (INDEPENDENT_AMBULATORY_CARE_PROVIDER_SITE_OTHER): Admitting: Primary Care

## 2019-01-10 ENCOUNTER — Other Ambulatory Visit: Payer: Self-pay

## 2019-01-10 DIAGNOSIS — R05 Cough: Secondary | ICD-10-CM

## 2019-01-10 DIAGNOSIS — Z8349 Family history of other endocrine, nutritional and metabolic diseases: Secondary | ICD-10-CM

## 2019-01-10 DIAGNOSIS — R000 Tachycardia, unspecified: Secondary | ICD-10-CM | POA: Insufficient documentation

## 2019-01-10 DIAGNOSIS — J4520 Mild intermittent asthma, uncomplicated: Secondary | ICD-10-CM | POA: Diagnosis not present

## 2019-01-10 DIAGNOSIS — R059 Cough, unspecified: Secondary | ICD-10-CM | POA: Insufficient documentation

## 2019-01-10 DIAGNOSIS — R109 Unspecified abdominal pain: Secondary | ICD-10-CM | POA: Diagnosis not present

## 2019-01-10 DIAGNOSIS — K219 Gastro-esophageal reflux disease without esophagitis: Secondary | ICD-10-CM | POA: Diagnosis not present

## 2019-01-10 LAB — CBC
HCT: 44.9 % (ref 36.0–46.0)
Hemoglobin: 15.1 g/dL — ABNORMAL HIGH (ref 12.0–15.0)
MCHC: 33.6 g/dL (ref 30.0–36.0)
MCV: 91.8 fl (ref 78.0–100.0)
Platelets: 240 10*3/uL (ref 150.0–400.0)
RBC: 4.88 Mil/uL (ref 3.87–5.11)
RDW: 12.6 % (ref 11.5–15.5)
WBC: 7 10*3/uL (ref 4.0–10.5)

## 2019-01-10 LAB — COMPREHENSIVE METABOLIC PANEL
ALT: 49 U/L — ABNORMAL HIGH (ref 0–35)
AST: 32 U/L (ref 0–37)
Albumin: 4.6 g/dL (ref 3.5–5.2)
Alkaline Phosphatase: 64 U/L (ref 39–117)
BUN: 8 mg/dL (ref 6–23)
CO2: 24 mEq/L (ref 19–32)
Calcium: 9.8 mg/dL (ref 8.4–10.5)
Chloride: 107 mEq/L (ref 96–112)
Creatinine, Ser: 1 mg/dL (ref 0.40–1.20)
GFR: 68.9 mL/min (ref >60.00)
Glucose, Bld: 73 mg/dL (ref 70–99)
Potassium: 4.4 mEq/L (ref 3.5–5.1)
Sodium: 139 mEq/L (ref 135–145)
Total Bilirubin: 1.1 mg/dL (ref 0.2–1.2)
Total Protein: 7.4 g/dL (ref 6.0–8.3)

## 2019-01-10 LAB — T4, FREE: Free T4: 1.1 ng/dL (ref 0.60–1.60)

## 2019-01-10 LAB — TSH: TSH: 1.11 u[IU]/mL (ref 0.35–4.50)

## 2019-01-10 MED ORDER — ALBUTEROL SULFATE HFA 108 (90 BASE) MCG/ACT IN AERS: 2.0000 | INHALATION_SPRAY | Freq: Four times a day (QID) | RESPIRATORY_TRACT | 0 refills | Status: DC | PRN

## 2019-01-10 MED ORDER — ONDANSETRON 4 MG PO TBDP: 4.0000 mg | ORAL_TABLET | Freq: Three times a day (TID) | ORAL | 0 refills | Status: AC | PRN

## 2019-01-10 NOTE — Assessment & Plan Note (Addendum)
Overall well controlled, uses albuterol infrequently for the most part, mostly during Summer months. Refill sent to pharmacy. No wheezing on exam.

## 2019-01-10 NOTE — Assessment & Plan Note (Addendum)
Noted during exam today, initial resting heart rate of 134, decreased to 120. From chart review it seems as though her baseline HR is in the low 100's.  Denies palpitations. Does smoke on occasion, no cigarette use within the last one hour. No excessive caffeine use. Check labs including TSH, CBC, CMP.  ECG machine in our office is out of service. Consider cardiology evaluation once labs return.

## 2019-01-10 NOTE — Patient Instructions (Addendum)
Stop by the lab prior to leaving today. I will notify you of your results once received.   It was a pleasure to see you today!  

## 2019-01-10 NOTE — Assessment & Plan Note (Signed)
Following with GI who has since referred her to general surgery for potential removal of gall bladder. Continues to experience daily nausea, taking Zofran 3 times weekly on average at night.   No improvement on PPI's without improvement.

## 2019-01-10 NOTE — Assessment & Plan Note (Addendum)
No improvement with high dose PPI's. Following with GI.

## 2019-01-10 NOTE — Progress Notes (Signed)
Subjective:    Patient ID: Megan Brady, female    DOB: [birth year], 23 y.o.   MRN: [id number]  HPI  Ms. Primeau is a 23 year old female with a family history of hypothyroidism who presents today for thyroid disease screening. She is also needing refills of her inhaler and Zofran.  Family history of hypothyroidism in her mother, also several maternal aunts, and her maternal grandmother. She was with her mother during her mother's PCP appointment and her mother's PCP told her that she needed her thyroid checked.  Today she endorses acute fatigue for a few months, some hair loss for which she attributes to psoriasis, some intolerance to the cold, muscle aches in the morning and evening. She is a sometimes smoker, has had a dry cough for the last two months.  Working with GI to evaluate chronic abdominal cramping/pain. Will be meeting with general surgery to discuss potential removal of her gall bladder. No longer on PPI, using Zofran 3 times weekly on average due to nausea with meals.  She feels that her asthma is well controlled, using albuterol inhaler infrequently during Summer months mostly. She's had a dry cough for the last 2 months, thought she had a sinus infection and was treated with antibiotics. Denies fevers.   Review of Systems  Constitutional: Negative for fever.  HENT: Negative for congestion and sinus pressure.   Respiratory: Positive for cough. Negative for shortness of breath.   Cardiovascular: Negative for chest pain and palpitations.  Gastrointestinal: Positive for nausea.       Intermittent abdominal pain       Past Medical History:  Diagnosis Date  . Asthma   . Exercise-induced cramps of lower extremity    exercise induced compartment syndrome  . Hematuria   . Kidney disease    as a child  . Kidney stones   . Seasonal allergies      Social History   Socioeconomic History  . Marital status: Single    Spouse name: Not on file  . Number of children:  Not on file  . Years of education: Not on file  . Highest education level: Not on file  Occupational History  . Not on file  Social Needs  . Financial resource strain: Not on file  . Food insecurity    Worry: Not on file    Inability: Not on file  . Transportation needs    Medical: Not on file    Non-medical: Not on file  Tobacco Use  . Smoking status: Current Every Day Smoker    Packs/day: 0.50    Types: Cigarettes  . Smokeless tobacco: Never Used  Substance and Sexual Activity  . Alcohol use: Yes    Frequency: Never    Comment: seldom  . Drug use: No  . Sexual activity: Yes  Lifestyle  . Physical activity    Days per week: Not on file    Minutes per session: Not on file  . Stress: Not on file  Relationships  . Social Herbalist on phone: Not on file    Gets together: Not on file    Attends religious service: Not on file    Active member of club or organization: Not on file    Attends meetings of clubs or organizations: Not on file    Relationship status: Not on file  . Intimate partner violence    Fear of current or ex partner: Not on file    Emotionally abused:  Not on file    Physically abused: Not on file    Forced sexual activity: Not on file  Other Topics Concern  . Not on file  Social History Narrative   Married.   No children.   Works in [employer].   Enjoys reading, spending time outdoors.     Past Surgical History:  Procedure Laterality Date  . WISDOM TOOTH EXTRACTION      Family History  Problem Relation Age of Onset  . Hypertension Mother   . Hypothyroidism Mother   . Lung cancer Maternal Grandmother   . Lung cancer Maternal Aunt   . Diabetes Paternal Grandfather   . Heart disease Paternal Grandfather   . Colon cancer Neg Hx   . Stomach cancer Neg Hx   . AAA (abdominal aortic aneurysm) Neg Hx   . Esophageal cancer Neg Hx   . Inflammatory bowel disease Neg Hx   . Liver disease Neg Hx   . Pancreatic cancer Neg Hx   . Rectal  cancer Neg Hx     No Known Allergies  Current Outpatient Medications on File Prior to Visit  Medication Sig Dispense Refill  . albuterol (PROVENTIL HFA;VENTOLIN HFA) 108 (90 Base) MCG/ACT inhaler Inhale 2 puffs into the lungs every 6 (six) hours as needed. 1 Inhaler 0  . medroxyPROGESTERone (DEPO-PROVERA) 150 MG/ML injection INJ 1 ML UTD Q 11 TO 12 WEEKS  4  . ondansetron (ZOFRAN-ODT) 4 MG disintegrating tablet Take 1 tablet (4 mg total) by mouth every 8 (eight) hours as needed for nausea. (Patient not taking: Reported on 01/10/2019) 20 tablet 0   No current facility-administered medications on file prior to visit.     BP 134/80   Pulse (!) 120   Temp 97.9 F (36.6 C) (Temporal)   Ht 5\' 6"  (1.676 m)   Wt 155 lb 8 oz (70.5 kg)   SpO2 98%   BMI 25.10 kg/m    Objective:   Physical Exam  Constitutional: She appears well-nourished.  Neck: Neck supple.  Cardiovascular: Regular rhythm. Tachycardia present.  Respiratory: Effort normal and breath sounds normal.  Skin: Skin is warm and dry.  Psychiatric: She has a normal mood and affect.           Assessment & Plan:

## 2019-01-10 NOTE — Assessment & Plan Note (Signed)
Dry and noted during entire exam. Doesn't appear infectious, does have history of asthma, environmental allergies, and uncontrolled reflux.  Will have her start with nightly Zyrtec. Add in PPI if cough continues.  She will update.

## 2019-01-10 NOTE — Assessment & Plan Note (Signed)
Hypothyroidism in mother, maternal aunts, and maternal grandmother.  Some symptoms of potential thyroid disease. TSH from December 2019 normal. Repeat TSH and Free T4 pending.

## 2019-01-17 ENCOUNTER — Ambulatory Visit (INDEPENDENT_AMBULATORY_CARE_PROVIDER_SITE_OTHER): Admitting: *Deleted

## 2019-01-17 DIAGNOSIS — R000 Tachycardia, unspecified: Secondary | ICD-10-CM

## 2019-01-18 ENCOUNTER — Other Ambulatory Visit: Payer: Self-pay | Admitting: Primary Care

## 2019-01-18 DIAGNOSIS — R000 Tachycardia, unspecified: Secondary | ICD-10-CM

## 2019-01-20 DIAGNOSIS — R000 Tachycardia, unspecified: Secondary | ICD-10-CM

## 2019-01-24 ENCOUNTER — Encounter: Payer: Self-pay | Admitting: Cardiovascular Disease

## 2019-01-24 ENCOUNTER — Ambulatory Visit
Admission: RE | Admit: 2019-01-24 | Discharge: 2019-01-24 | Disposition: A | Discharge status: Home / Self Care | Source: Ambulatory Visit | Attending: Primary Care | Admitting: Primary Care

## 2019-01-24 ENCOUNTER — Ambulatory Visit (INDEPENDENT_AMBULATORY_CARE_PROVIDER_SITE_OTHER): Admitting: Cardiovascular Disease

## 2019-01-24 ENCOUNTER — Other Ambulatory Visit: Payer: Self-pay

## 2019-01-24 ENCOUNTER — Ambulatory Visit (INDEPENDENT_AMBULATORY_CARE_PROVIDER_SITE_OTHER)

## 2019-01-24 VITALS — BP 116/70 | HR 90 | Ht 66.0 in | Wt 155.5 lb

## 2019-01-24 DIAGNOSIS — R000 Tachycardia, unspecified: Secondary | ICD-10-CM

## 2019-01-24 DIAGNOSIS — I479 Paroxysmal tachycardia, unspecified: Secondary | ICD-10-CM

## 2019-01-24 NOTE — Patient Instructions (Signed)
Medication Instructions:  Your physician recommends that you continue on your current medications as directed. Please refer to the Current Medication list given to you today.  If you need a refill on your cardiac medications before your next appointment, please call your pharmacy.   Lab work: None ordered If you have labs (blood work) drawn today and your tests are completely normal, you will receive your results only by: Marland Kitchen MyChart Message (if you have MyChart) OR . A paper copy in the mail If you have any lab test that is abnormal or we need to change your treatment, we will call you to review the results.  Testing/Procedures: Your physician has recommended that you wear an zio monitor. Zio monitors are medical devices that record the heart's electrical activity. Doctors most often [place] these monitors to diagnose arrhythmias. Arrhythmias are problems with the speed or rhythm of the heartbeat. The monitor is a small, portable device. You can wear one while you do your normal daily activities. This is usually used to diagnose what is causing palpitations/syncope (passing out).    Follow-Up: At [facility], you and your health needs are our priority.  As part of our continuing mission to provide you with exceptional heart care, we have created designated Provider Care Teams.  These Care Teams include your primary Cardiologist (physician) and Advanced Practice Providers (APPs -  Physician Assistants and Nurse Practitioners) who all work together to provide you with the care you need, when you need it. You will need a follow up appointment as needed   You may see  Dr. Fletcher Anon or one of the following Advanced Practice Providers on your designated Care Team:   Murray Hodgkins, NP Christell Faith, PA-C . Marrianne Mood, PA-C  Any Other Special Instructions Will Be Listed Below (If Applicable).   Your physician has recommended that you wear a Zio monitor. This monitor is a medical device that records  the heart's electrical activity. Doctors most often use these monitors to diagnose arrhythmias. Arrhythmias are problems with the speed or rhythm of the heartbeat. The monitor is a small device applied to your chest. You can wear one while you do your normal daily activities. While wearing this monitor if you have any symptoms to push the button and record what you felt. Once you have worn this monitor for the period of time provider prescribed (Usually 14 days), you will return the monitor device in the postage paid box. Once it is returned they will download the data collected and provide [place] with a report which the provider will then review and we will call you with those results. Important tips:  1. Avoid showering during the first 24 hours of wearing the monitor. 2. Avoid excessive sweating to help maximize wear time. 3. Do not submerge the device, no hot tubs, and no swimming pools. 4. Keep any lotions or oils away from the patch. 5. After 24 hours you may shower with the patch on. Take brief showers with your back facing the shower head.  6. Do not remove patch once it has been placed because that will interrupt data and decrease adhesive wear time. 7. Push the button when you have any symptoms and write down what you were feeling. 8. Once you have completed wearing your monitor, remove and place into box which has postage paid and place in your outgoing mailbox.  9. If for some reason you have misplaced your box then call our office and we can provide another box and/or  mail it off for you.

## 2019-01-24 NOTE — Progress Notes (Signed)
Cardiology Office Note  Date:  01/24/2019   ID:  Megan Brady, DOB 13-Dec-1995, MRN [id number]  PCP:  Pleas Koch, NP   Chief Complaint  Patient presents with  . New Patient (Initial Visit)    Referred by PCP for fast HR. Meds reviewed vebrally with patient.     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, Dx with it senior highschool Who presents by referral from Alma Friendly for consultation of her tachycardia  Prior office note/visit documenting sinus tachycardia She reports that she is asymptomatic Denies any shortness of breath on exertion No exercise intolerance  Last office visit January 10, 2019 heart rate 120 Follow-up EKG January 17, 2019 heart rate 107 Pulse 103 in 12/04/17 Pulse 90 earlier in 2019  EKG personally reviewed by myself on todays visit Shows normal sinus rhythm rate 90 bpm no significant ST or T wave changes  She is concerned about any potential for tachycardia, reports that she needs to have her gallbladder taken out Reports that the surgeons might be worried  She is also concerned that tachycardia might be contributing to her insomnia symptoms   PMH:   has a past medical history of Asthma, Exercise-induced cramps of lower extremity, Hematuria, Kidney disease, Kidney stones, and Seasonal allergies.  PSH:    Past Surgical History:  Procedure Laterality Date  . WISDOM TOOTH EXTRACTION      Current Outpatient Medications  Medication Sig Dispense Refill  . albuterol (VENTOLIN HFA) 108 (90 Base) MCG/ACT inhaler Inhale 2 puffs into the lungs every 6 (six) hours as needed. 18 g 0  . medroxyPROGESTERone (DEPO-PROVERA) 150 MG/ML injection INJ 1 ML UTD Q 11 TO 12 WEEKS  4  . ondansetron (ZOFRAN-ODT) 4 MG disintegrating tablet Take 1 tablet (4 mg total) by mouth every 8 (eight) hours as needed for nausea. 20 tablet 0   No current facility-administered medications for this visit.       Allergies:   Patient has no known allergies.   Social History:  The patient  reports that she has been smoking cigarettes. She has been smoking about 0.50 packs per day. She has never used smokeless tobacco. She reports current alcohol use. She reports that she does not use drugs.   Family History:   family history includes Diabetes in her paternal grandfather; Heart disease in her paternal grandfather; Hypertension in her mother; Hypothyroidism in her mother; Lung cancer in her maternal aunt and maternal grandmother.    Review of Systems: Review of Systems  Constitutional: Negative.   HENT: Negative.   Respiratory: Negative.   Cardiovascular: Negative.   Gastrointestinal: Negative.   Musculoskeletal: Negative.   Neurological: Negative.   Psychiatric/Behavioral: Negative.   All other systems reviewed and are negative.    PHYSICAL EXAM: VS:  BP 116/70 (BP Location: Left Arm, Patient Position: Sitting, Cuff Size: Normal)   Pulse 90   Ht 5\' 6"  (1.676 m)   Wt 155 lb 8 oz (70.5 kg)   BMI 25.10 kg/m  , BMI Body mass index is 25.1 kg/m. GEN: Well nourished, well developed, in no acute distress HEENT: normal Neck: no JVD, carotid bruits, or masses Cardiac: RRR; no murmurs, rubs, or gallops,no edema  Respiratory:  clear to auscultation bilaterally, normal work of breathing GI: soft, nontender, nondistended, + BS MS: no deformity or atrophy Skin: warm and dry, no rash Neuro:  Strength and sensation are intact Psych: euthymic mood, full affect  Recent Labs: 01/10/2019: ALT 49; BUN 8; Creatinine, Ser 1.00; Hemoglobin 15.1; Platelets 240.0; Potassium 4.4; Sodium 139; TSH 1.11    Lipid Panel No results found for: CHOL, HDL, LDLCALC, TRIG    Wt Readings from Last 3 Encounters:  01/24/19 155 lb 8 oz (70.5 kg)  01/10/19 155 lb 8 oz (70.5 kg)  05/04/18 149 lb (67.6 kg)       ASSESSMENT AND PLAN:  Problem List Items Addressed This Visit    None    Visit Diagnoses     Paroxysmal tachycardia (HCC)    -  Primary   Relevant Orders   EKG 12-Lead   LONG TERM MONITOR (3-14 DAYS)   Sinus tachycardia         Long discussion with her concerning tachycardia She is asymptomatic, all of her measurements seem to be in the doctor's office She is concerned about possible tachycardia at nighttime contributing to insomnia Also concerned about tachycardia prior to any gallbladder surgery -Discussed various methods to monitor heart rate including self monitoring using her smart phone, a finger pulse ox meter, Lastly ordering a event monitor She prefers a 2-week Zio monitor which will allow her to monitor heart rate at nighttime Monitor was ordered, placed today We will call her with the results once they are available -As she is asymptomatic, even if she has mild tachycardia, she would likely not need beta-blockers on a regular basis.  Disposition:   F/U as needed   Total encounter time more than 60 minutes  Greater than 50% was spent in counseling and coordination of care with the patient  Patient was seen in consultation for Megan Brady and will be referred back to her office for ongoing care of the issues detailed above    Signed, Dossie Arbourim Kaiyana Bedore, M.D., Ph.D. [facility], [place] [phone]

## 2019-02-02 ENCOUNTER — Encounter (HOSPITAL_COMMUNITY): Payer: Self-pay | Admitting: Emergency Medicine

## 2019-02-02 ENCOUNTER — Other Ambulatory Visit: Payer: Self-pay

## 2019-02-02 ENCOUNTER — Emergency Department (HOSPITAL_COMMUNITY)
Admission: EM | Admit: 2019-02-02 | Discharge: 2019-02-02 | Disposition: A | Discharge status: Home / Self Care | Attending: Emergency Medicine | Admitting: Emergency Medicine

## 2019-02-02 DIAGNOSIS — R109 Unspecified abdominal pain: Secondary | ICD-10-CM

## 2019-02-02 DIAGNOSIS — R319 Hematuria, unspecified: Secondary | ICD-10-CM

## 2019-02-02 DIAGNOSIS — F17210 Nicotine dependence, cigarettes, uncomplicated: Secondary | ICD-10-CM | POA: Diagnosis not present

## 2019-02-02 DIAGNOSIS — N200 Calculus of kidney: Secondary | ICD-10-CM | POA: Insufficient documentation

## 2019-02-02 DIAGNOSIS — J45909 Unspecified asthma, uncomplicated: Secondary | ICD-10-CM | POA: Insufficient documentation

## 2019-02-02 DIAGNOSIS — Z79899 Other long term (current) drug therapy: Secondary | ICD-10-CM | POA: Diagnosis not present

## 2019-02-02 LAB — URINALYSIS, ROUTINE W REFLEX MICROSCOPIC
Bilirubin Urine: NEGATIVE
Glucose, UA: NEGATIVE mg/dL
Ketones, ur: NEGATIVE mg/dL
Leukocytes,Ua: NEGATIVE
Nitrite: NEGATIVE
Protein, ur: NEGATIVE mg/dL
Specific Gravity, Urine: 1.026 (ref 1.005–1.030)
pH: 5 (ref 5.0–8.0)

## 2019-02-02 LAB — CBC WITH DIFFERENTIAL/PLATELET
Abs Immature Granulocytes: 0.02 10*3/uL (ref 0.00–0.07)
Basophils Absolute: 0 10*3/uL (ref 0.0–0.1)
Basophils Relative: 0 %
Eosinophils Absolute: 0.1 10*3/uL (ref 0.0–0.5)
Eosinophils Relative: 1 %
HCT: 42.7 % (ref 36.0–46.0)
Hemoglobin: 14.4 g/dL (ref 12.0–15.0)
Immature Granulocytes: 0 %
Lymphocytes Relative: 27 %
Lymphs Abs: 1.9 10*3/uL (ref 0.7–4.0)
MCH: 30.9 pg (ref 26.0–34.0)
MCHC: 33.7 g/dL (ref 30.0–36.0)
MCV: 91.6 fL (ref 80.0–100.0)
Monocytes Absolute: 0.5 10*3/uL (ref 0.1–1.0)
Monocytes Relative: 7 %
Neutro Abs: 4.6 10*3/uL (ref 1.7–7.7)
Neutrophils Relative %: 65 %
Platelets: 238 10*3/uL (ref 150–400)
RBC: 4.66 MIL/uL (ref 3.87–5.11)
RDW: 11.9 % (ref 11.5–15.5)
WBC: 7.2 10*3/uL (ref 4.0–10.5)
nRBC: 0 % (ref 0.0–0.2)

## 2019-02-02 LAB — COMPREHENSIVE METABOLIC PANEL
ALT: 25 U/L (ref 0–44)
AST: 21 U/L (ref 15–41)
Albumin: 4.4 g/dL (ref 3.5–5.0)
Alkaline Phosphatase: 65 U/L (ref 38–126)
Anion gap: 7 (ref 5–15)
BUN: 15 mg/dL (ref 6–20)
CO2: 21 mmol/L — ABNORMAL LOW (ref 22–32)
Calcium: 9.3 mg/dL (ref 8.9–10.3)
Chloride: 110 mmol/L (ref 98–111)
Creatinine, Ser: 0.97 mg/dL (ref 0.44–1.00)
GFR calc Af Amer: >60 mL/min (ref >60)
GFR calc non Af Amer: >60 mL/min (ref >60)
Glucose, Bld: 96 mg/dL (ref 70–99)
Potassium: 4 mmol/L (ref 3.5–5.1)
Sodium: 138 mmol/L (ref 135–145)
Total Bilirubin: 0.9 mg/dL (ref 0.3–1.2)
Total Protein: 8 g/dL (ref 6.5–8.1)

## 2019-02-02 LAB — I-STAT BETA HCG BLOOD, ED (MC, WL, AP ONLY): I-stat hCG, quantitative: <5 m[IU]/mL (ref <5)

## 2019-02-02 LAB — POC URINE PREG, ED: Preg Test, Ur: NEGATIVE

## 2019-02-02 MED ORDER — TAMSULOSIN HCL 0.4 MG PO CAPS: 0.4000 mg | ORAL_CAPSULE | Freq: Every day | ORAL | 0 refills | Status: DC

## 2019-02-02 MED ORDER — HYDROCODONE-ACETAMINOPHEN 5-325 MG PO TABS: 1.0000 | ORAL_TABLET | ORAL | 0 refills | Status: DC | PRN

## 2019-02-02 MED ORDER — KETOROLAC TROMETHAMINE 15 MG/ML IJ SOLN
15.0000 mg | Freq: Once | INTRAMUSCULAR | Status: AC
  Administered 2019-02-02: 10:00:00 15 mg via INTRAVENOUS
  Filled 2019-02-02: qty 1

## 2019-02-02 MED ORDER — SODIUM CHLORIDE 0.9 % IV SOLN
Freq: Once | INTRAVENOUS | Status: AC
  Administered 2019-02-02: 10:00:00 via INTRAVENOUS

## 2019-02-02 MED ORDER — NAPROXEN 375 MG PO TABS: 375.0000 mg | ORAL_TABLET | Freq: Two times a day (BID) | ORAL | 0 refills | Status: DC

## 2019-02-02 MED ORDER — ONDANSETRON HCL 4 MG/2ML IJ SOLN
4.0000 mg | Freq: Once | INTRAMUSCULAR | Status: AC
  Administered 2019-02-02: 4 mg via INTRAVENOUS
  Filled 2019-02-02: qty 2

## 2019-02-02 NOTE — ED Notes (Signed)
ED NP at bedside

## 2019-02-02 NOTE — Discharge Instructions (Signed)
Call to schedule an appointment. Do not drive while taking the pain medication as it will make you sleepy.

## 2019-02-02 NOTE — ED Provider Notes (Signed)
[facility]-EMERGENCY DEPT Provider Note   CSN: [id number] Arrival date & time: 02/02/19  0911     History   Chief Complaint Chief Complaint  Patient presents with   Flank Pain    HPI Megan Brady is a 23 y.o. female who works at [employer] and hx of tachycardia, asthma, dilated bile duct, kidney stones, presents to the ED with c/o right flank pain that started early this morning. Patient with hx of kidney stones and gallbladder problems. Last kidney stone 2 years ago. Last episode of gallbladder problems 2 months ago. Patient reports that she did see GI for her pain and they wanted to take her gallbladder out but she did not want surgery. Patient reports she is not sure if this pain is kidney stone or gallbladder. Patient took Zofran for her nausea prior to arrival to the ED. Patient new to [facility] so does not have a Insurance underwriter here.      The history is provided by the patient.  Flank Pain This is a new problem. The current episode started 3 to 5 hours ago. The problem occurs constantly. The problem has been gradually worsening. Associated symptoms include abdominal pain. Pertinent negatives include no chest pain, no headaches and no shortness of breath. Nothing aggravates the symptoms. Nothing relieves the symptoms.    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      Home Medications    Prior to Admission medications   Medication Sig Start Date End Date Taking? Authorizing Provider  albuterol (VENTOLIN HFA) 108 (90 Base) MCG/ACT inhaler Inhale 2 puffs into the lungs every 6 (six) hours as needed. Patient taking differently: Inhale 2 puffs into the lungs every 6 (six) hours as needed for wheezing.  01/10/19  Yes Doreene Nest, NP  doxylamine, Sleep, (UNISOM) 25 MG tablet Take 25 mg by mouth at bedtime as needed for sleep.   Yes [provider]  medroxyPROGESTERone (DEPO-PROVERA) 150 MG/ML injection Inject 150 mg into the muscle every 3 (three) months.  03/30/18  Yes [provider]  ondansetron (ZOFRAN-ODT) 4 MG disintegrating tablet Take 1 tablet (4 mg total) by mouth every 8 (eight) hours as needed for nausea. 01/10/19  Yes Doreene Nest, NP  HYDROcodone-acetaminophen (NORCO/VICODIN) 5-325 MG tablet Take 1 tablet by mouth every 4 (four) hours as needed. 02/02/19   Janne Napoleon, NP  naproxen (NAPROSYN) 375 MG tablet Take 1 tablet (375 mg total) by mouth 2 (two) times daily. 02/02/19   Janne Napoleon, NP  tamsulosin (FLOMAX) 0.4 MG CAPS capsule Take 1 capsule (0.4 mg total) by mouth daily. Until follow up with Urology 02/02/19  Debroah Baller M, NP    Family History Family History  Problem Relation Age of Onset   Hypertension Mother    Hypothyroidism Mother    Lung cancer Maternal Grandmother    Lung cancer Maternal Aunt    Diabetes Paternal Grandfather    Heart disease Paternal Grandfather    Colon cancer Neg Hx    Stomach cancer Neg Hx    AAA (abdominal aortic aneurysm) Neg Hx    Esophageal cancer Neg Hx    Inflammatory bowel disease Neg Hx    Liver disease Neg Hx    Pancreatic cancer Neg Hx    Rectal cancer Neg Hx     Social History Social  History   Tobacco Use   Smoking status: Current Every Day Smoker    Packs/day: 0.50    Types: Cigarettes   Smokeless tobacco: Never Used  Substance Use Topics   Alcohol use: Yes    Frequency: Never    Comment: seldom   Drug use: No     Allergies   Patient has no known allergies.   Review of Systems Review of Systems  Constitutional: Negative for chills and fever.  HENT: Negative.   Eyes: Negative for pain and redness.  Respiratory: Negative for cough, chest tightness, shortness of breath and wheezing.   Cardiovascular: Negative for chest pain.  Gastrointestinal: Positive for abdominal pain and nausea.  Genitourinary: Positive for flank pain.  Musculoskeletal: Positive for back pain.  Skin: Negative for rash.  Neurological: Negative for dizziness, syncope and headaches.  Psychiatric/Behavioral: Positive for sleep disturbance. Negative for confusion.     Physical Exam Updated Vital Signs BP 117/69 (BP Location: Left Arm)    Pulse 71    Temp 97.7 F (36.5 C) (Oral)    Resp 18    SpO2 96%   Physical Exam Vitals signs and nursing note reviewed.  Constitutional:      Appearance: She is well-developed and normal weight.  HENT:     Head: Normocephalic.  Eyes:     Conjunctiva/sclera: Conjunctivae normal.  Neck:     Musculoskeletal: Neck supple.  Cardiovascular:     Rate and Rhythm: Normal rate.  Pulmonary:     Effort: Pulmonary effort is normal.  Abdominal:     Palpations: Abdomen is soft.     Tenderness: There is abdominal tenderness in the right upper quadrant. There is right CVA tenderness. There is no guarding or rebound.  Musculoskeletal: Normal range of motion.  Skin:    General: Skin is warm and dry.  Neurological:     Mental Status: She is alert and oriented to person, place, and time.     Cranial Nerves: No cranial nerve deficit.  Psychiatric:        Mood and Affect: Mood normal.      ED Treatments / Results  Labs (all labs ordered are  listed, but only abnormal results are displayed) Labs Reviewed  URINALYSIS, ROUTINE W REFLEX MICROSCOPIC - Abnormal; Notable for the following components:      Result Value   Hgb urine dipstick MODERATE (*)    Bacteria, UA RARE (*)    All other components within normal limits  COMPREHENSIVE METABOLIC PANEL - Abnormal; Notable for the following components:   CO2 21 (*)    All other components within normal limits  CBC WITH DIFFERENTIAL/PLATELET  POC URINE PREG, ED  I-STAT BETA HCG BLOOD, ED ([facility], [facility], [facility] ONLY)   Radiology No results found.  Procedures Procedures (including critical  care time)  Medications Ordered in ED Medications  0.9 %  sodium chloride infusion ( Intravenous Stopped 02/02/19 1210)  ketorolac (TORADOL) 15 MG/ML injection 15 mg (15 mg Intravenous Given 02/02/19 1005)  ondansetron (ZOFRAN) injection 4 mg (4 mg Intravenous Given 02/02/19 1005)     Initial Impression / Assessment and Plan / ED Course  I have reviewed the triage vital signs and the nursing notes.  Pertinent lab results that were available during my care of the patient were reviewed by me and considered in my medical decision making (see chart for details). Patient symptoms resolved with treatment in the ED. Stable for d/c pain free. Will treat with pain medication, Zofran and NSAIDS and Flomax. referral to urology. I discussed this case with Dr. Adela LankFloyd. Patient to return for worsening symptoms.   Final Clinical Impressions(s) / ED Diagnoses   Final diagnoses:  Right flank pain  Kidney stone on right side  Hematuria, unspecified type    ED Discharge Orders         Ordered    tamsulosin (FLOMAX) 0.4 MG CAPS capsule  Daily     02/02/19 1222    naproxen (NAPROSYN) 375 MG tablet  2 times daily     02/02/19 1222    HYDROcodone-acetaminophen (NORCO/VICODIN) 5-325 MG tablet  Every 4 hours PRN     02/02/19 1222           BarcelonetaNeese, GadsdenHope M, NP 02/02/19 1226    Melene PlanFloyd, Dan, DO 02/02/19 1232

## 2019-02-02 NOTE — ED Triage Notes (Signed)
Pt reports right flank pains since early this morning. Hx kidney stones.

## 2019-02-19 ENCOUNTER — Ambulatory Visit: Payer: Self-pay | Admitting: General Surgery

## 2019-03-13 NOTE — Progress Notes (Signed)
[id number] [address], [address] [place] [address] Phone: [phone] Fax: [phone]      Your procedure is scheduled on October 29th, 2020 .  Report to [facility] Entr[facility] "A" at 12:00 P.M., and check in at the Admitting office.  Call this number if you have problems the morning of surgery:  [phone]  Call [phone] if you have any questions prior to your surgery date Monday-Friday 8am-4pm    Remember:  Do not eat or drink after midnight the night before your surgery    Take these medicines the morning of surgery with A SIP OF WATER :  Albuterol (Ventolin) inhaler - if needed (bring with you day of surgery) Ondansetron (Zofran) - if needed  7 days prior to surgery STOP taking any Aspirin (unless otherwise instructed by your surgeon), Aleve, Naproxen, Ibuprofen, Motrin, Advil, Goody's, BC's, all herbal medications, fish oil, and all vitamins.    The Morning of Surgery  Do not wear jewelry, make-up or nail polish.  Do not wear lotions, powders, or perfumes/colognes, or deodorant  Do not shave 48 hours prior to surgery.  Men may shave face and neck.  Do not bring valuables to the hospital.  [facility] is not responsible for any belongings or valuables.  If you are a smoker, DO NOT Smoke 24 hours prior to surgery IF you wear a CPAP at night please bring your mask, tubing, and machine the morning of surgery   Remember that you must have someone to transport you home after your surgery, and remain with you for 24 hours if you are discharged the same day.   Contacts, glasses, hearing aids, dentures or bridgework may not be worn into surgery.    Leave your suitcase in the car.  After surgery it may be brought to your room.  For patients admitted to the hospital, discharge time will be determined by your treatment team.  Patients discharged the day of surgery will not  be allowed to drive home.    Special instructions:   [place]- Preparing For Surgery  Before surgery, you can play an important role. Because skin is not sterile, your skin needs to be as free of germs as possible. You can reduce the number of germs on your skin by washing with CHG (chlorahexidine gluconate) Soap before surgery.  CHG is an antiseptic cleaner which kills germs and bonds with the skin to continue killing germs even after washing.    Oral Hygiene is also important to reduce your risk of infection.  Remember - BRUSH YOUR TEETH THE MORNING OF SURGERY WITH YOUR REGULAR TOOTHPASTE  Please do not use if you have an allergy to CHG or antibacterial soaps. If your skin becomes reddened/irritated stop using the CHG.  Do not shave (including legs and underarms) for at least 48 hours prior to first CHG shower. It is OK to shave your face.  Please follow these instructions carefully.   1. Shower the NIGHT BEFORE SURGERY and the MORNING OF SURGERY with CHG Soap.   2. If you chose to wash your hair, wash your hair first as usual with your normal shampoo.  3. After you shampoo, rinse your hair and body thoroughly to remove the shampoo.  4. Use CHG as you would any other liquid soap. You can apply CHG directly to the skin and wash gently with a scrungie or a  clean washcloth.   5. Apply the CHG Soap to your body ONLY FROM THE NECK DOWN.  Do not use on open wounds or open sores. Avoid contact with your eyes, ears, mouth and genitals (private parts). Wash Face and genitals (private parts)  with your normal soap.   6. Wash thoroughly, paying special attention to the area where your surgery will be performed.  7. Thoroughly rinse your body with warm water from the neck down.  8. DO NOT shower/wash with your normal soap after using and rinsing off the CHG Soap.  9. Pat yourself dry with a CLEAN TOWEL.  10. Wear CLEAN PAJAMAS to bed the night before surgery, wear comfortable clothes the  morning of surgery  11. Place CLEAN SHEETS on your bed the night of your first shower and DO NOT SLEEP WITH PETS.    Day of Surgery:  Do not apply any deodorants/lotions. Please shower the morning of surgery with the CHG soap  Please wear clean clothes to the hospital/surgery center.   Remember to brush your teeth WITH YOUR REGULAR TOOTHPASTE.   Please read over the following fact sheets that you were given.

## 2019-03-14 ENCOUNTER — Other Ambulatory Visit: Payer: Self-pay

## 2019-03-14 ENCOUNTER — Encounter (HOSPITAL_COMMUNITY): Payer: Self-pay

## 2019-03-14 ENCOUNTER — Encounter (HOSPITAL_COMMUNITY)
Admission: RE | Admit: 2019-03-14 | Discharge: 2019-03-14 | Disposition: A | Discharge status: Home / Self Care | Source: Ambulatory Visit | Attending: General Surgery | Admitting: General Surgery

## 2019-03-14 DIAGNOSIS — Z01812 Encounter for preprocedural laboratory examination: Secondary | ICD-10-CM | POA: Diagnosis present

## 2019-03-14 LAB — CBC
HCT: 44.8 % (ref 36.0–46.0)
Hemoglobin: 15 g/dL (ref 12.0–15.0)
MCH: 31.1 pg (ref 26.0–34.0)
MCHC: 33.5 g/dL (ref 30.0–36.0)
MCV: 92.8 fL (ref 80.0–100.0)
Platelets: 238 10*3/uL (ref 150–400)
RBC: 4.83 MIL/uL (ref 3.87–5.11)
RDW: 11.9 % (ref 11.5–15.5)
WBC: 5.9 10*3/uL (ref 4.0–10.5)
nRBC: 0 % (ref 0.0–0.2)

## 2019-03-14 LAB — COMPREHENSIVE METABOLIC PANEL
ALT: 30 U/L (ref 0–44)
AST: 25 U/L (ref 15–41)
Albumin: 4.1 g/dL (ref 3.5–5.0)
Alkaline Phosphatase: 63 U/L (ref 38–126)
Anion gap: 10 (ref 5–15)
BUN: 9 mg/dL (ref 6–20)
CO2: 20 mmol/L — ABNORMAL LOW (ref 22–32)
Calcium: 9.3 mg/dL (ref 8.9–10.3)
Chloride: 108 mmol/L (ref 98–111)
Creatinine, Ser: 0.99 mg/dL (ref 0.44–1.00)
GFR calc Af Amer: >60 mL/min (ref >60)
GFR calc non Af Amer: >60 mL/min (ref >60)
Glucose, Bld: 84 mg/dL (ref 70–99)
Potassium: 4 mmol/L (ref 3.5–5.1)
Sodium: 138 mmol/L (ref 135–145)
Total Bilirubin: 1.8 mg/dL — ABNORMAL HIGH (ref 0.3–1.2)
Total Protein: 6.9 g/dL (ref 6.5–8.1)

## 2019-03-14 NOTE — Progress Notes (Addendum)
Scheduled for Covid test on 03/18/19 No associating sx of Covid.  H/o travel to [place] last week. Was wearing a mask and social distancing.. No recent exposure to Covid positive patients.  PCP - Clark,Katherine,NP  Cardiologist -  Denies  Chest x-ray -   EKG - 01-24-19  Stress Test - denies  ECHO - denies  Cardiac Cath - denies  AICD-denies PM-denies LOOP-denies  Sleep Study - denies CPAP - NA  LABS-CBC,CMP  ASA-denies  ERAS-NA  HA1C-denies Fasting Blood Sugar -  Checks Blood Sugar _____ times a day  Anesthesia-  Pt denies having chest pain, sob, or fever at this time. All instructions explained to the pt, with a verbal understanding of the material. Pt agrees to go over the instructions while at home for a better understanding. Pt also instructed to self quarantine after being tested for COVID-19. The opportunity to ask questions was provided.

## 2019-03-18 ENCOUNTER — Other Ambulatory Visit (HOSPITAL_COMMUNITY)
Admission: RE | Admit: 2019-03-18 | Discharge: 2019-03-18 | Disposition: A | Discharge status: Home / Self Care | Source: Ambulatory Visit | Attending: General Surgery | Admitting: General Surgery

## 2019-03-18 DIAGNOSIS — Z20828 Contact with and (suspected) exposure to other viral communicable diseases: Secondary | ICD-10-CM | POA: Insufficient documentation

## 2019-03-18 DIAGNOSIS — Z01812 Encounter for preprocedural laboratory examination: Secondary | ICD-10-CM | POA: Insufficient documentation

## 2019-03-19 LAB — NOVEL CORONAVIRUS, NAA (HOSP ORDER, SEND-OUT TO REF LAB; TAT 18-24 HRS): SARS-CoV-2, NAA: NOT DETECTED

## 2019-03-21 ENCOUNTER — Encounter (HOSPITAL_COMMUNITY)
Admission: RE | Disposition: A | Discharge status: Home / Self Care | Payer: Self-pay | Source: Home / Self Care | Attending: General Surgery

## 2019-03-21 ENCOUNTER — Other Ambulatory Visit: Payer: Self-pay

## 2019-03-21 ENCOUNTER — Encounter (HOSPITAL_COMMUNITY): Payer: Self-pay

## 2019-03-21 ENCOUNTER — Ambulatory Visit (HOSPITAL_COMMUNITY): Admitting: Vascular Surgery

## 2019-03-21 ENCOUNTER — Ambulatory Visit (HOSPITAL_COMMUNITY)

## 2019-03-21 ENCOUNTER — Ambulatory Visit (HOSPITAL_COMMUNITY)
Admission: RE | Admit: 2019-03-21 | Discharge: 2019-03-21 | Disposition: A | Discharge status: Home / Self Care | Attending: General Surgery | Admitting: General Surgery

## 2019-03-21 ENCOUNTER — Ambulatory Visit (HOSPITAL_COMMUNITY): Admitting: Certified Registered Nurse Anesthetist

## 2019-03-21 DIAGNOSIS — Z419 Encounter for procedure for purposes other than remedying health state, unspecified: Secondary | ICD-10-CM

## 2019-03-21 DIAGNOSIS — K8010 Calculus of gallbladder with chronic cholecystitis without obstruction: Secondary | ICD-10-CM | POA: Diagnosis not present

## 2019-03-21 DIAGNOSIS — K219 Gastro-esophageal reflux disease without esophagitis: Secondary | ICD-10-CM | POA: Insufficient documentation

## 2019-03-21 DIAGNOSIS — J45909 Unspecified asthma, uncomplicated: Secondary | ICD-10-CM | POA: Diagnosis not present

## 2019-03-21 DIAGNOSIS — Z79899 Other long term (current) drug therapy: Secondary | ICD-10-CM | POA: Diagnosis not present

## 2019-03-21 DIAGNOSIS — F17210 Nicotine dependence, cigarettes, uncomplicated: Secondary | ICD-10-CM | POA: Insufficient documentation

## 2019-03-21 DIAGNOSIS — K8020 Calculus of gallbladder without cholecystitis without obstruction: Secondary | ICD-10-CM | POA: Diagnosis present

## 2019-03-21 HISTORY — DX: Family history of other specified conditions: Z84.89

## 2019-03-21 HISTORY — PX: CHOLECYSTECTOMY: SHX55

## 2019-03-21 LAB — POCT PREGNANCY, URINE: Preg Test, Ur: NEGATIVE

## 2019-03-21 SURGERY — LAPAROSCOPIC CHOLECYSTECTOMY
Anesthesia: General | Site: Abdomen

## 2019-03-21 MED ORDER — HYDROCODONE-ACETAMINOPHEN 5-325 MG PO TABS: 1.0000 | ORAL_TABLET | Freq: Four times a day (QID) | ORAL | 0 refills | Status: DC | PRN

## 2019-03-21 MED ORDER — CHLORHEXIDINE GLUCONATE CLOTH 2 % EX PADS: 6.0000 | MEDICATED_PAD | Freq: Once | CUTANEOUS | Status: DC

## 2019-03-21 MED ORDER — GABAPENTIN 300 MG PO CAPS
ORAL_CAPSULE | ORAL | Status: AC
  Administered 2019-03-21: 300 mg via ORAL
  Filled 2019-03-21: qty 1

## 2019-03-21 MED ORDER — OXYCODONE HCL 5 MG PO TABS: 5.0000 mg | ORAL_TABLET | Freq: Once | ORAL | Status: DC | PRN

## 2019-03-21 MED ORDER — STERILE WATER FOR IRRIGATION IR SOLN
Status: DC | PRN
  Administered 2019-03-21: 1000 mL

## 2019-03-21 MED ORDER — PROMETHAZINE HCL 25 MG/ML IJ SOLN
6.2500 mg | INTRAMUSCULAR | Status: AC | PRN
  Administered 2019-03-21 (×2): 6.25 mg via INTRAVENOUS

## 2019-03-21 MED ORDER — ACETAMINOPHEN 500 MG PO TABS
1000.0000 mg | ORAL_TABLET | ORAL | Status: AC
  Administered 2019-03-21: 12:00:00 1000 mg via ORAL

## 2019-03-21 MED ORDER — SCOPOLAMINE 1 MG/3DAYS TD PT72
MEDICATED_PATCH | TRANSDERMAL | Status: AC
  Filled 2019-03-21: qty 1

## 2019-03-21 MED ORDER — CELECOXIB 200 MG PO CAPS
200.0000 mg | ORAL_CAPSULE | ORAL | Status: AC
  Administered 2019-03-21: 12:00:00 200 mg via ORAL

## 2019-03-21 MED ORDER — SUGAMMADEX SODIUM 500 MG/5ML IV SOLN
INTRAVENOUS | Status: AC
  Filled 2019-03-21: qty 5

## 2019-03-21 MED ORDER — SCOPOLAMINE 1 MG/3DAYS TD PT72
1.0000 | MEDICATED_PATCH | Freq: Once | TRANSDERMAL | Status: AC
  Administered 2019-03-21: 1 via TRANSDERMAL

## 2019-03-21 MED ORDER — BUPIVACAINE-EPINEPHRINE 0.5% -1:200000 IJ SOLN
INTRAMUSCULAR | Status: AC
  Filled 2019-03-21: qty 1

## 2019-03-21 MED ORDER — SODIUM CHLORIDE 0.9 % IR SOLN
Status: DC | PRN
  Administered 2019-03-21: 1000 mL

## 2019-03-21 MED ORDER — ACETAMINOPHEN 500 MG PO TABS
ORAL_TABLET | ORAL | Status: AC
  Administered 2019-03-21: 1000 mg via ORAL
  Filled 2019-03-21: qty 2

## 2019-03-21 MED ORDER — FENTANYL CITRATE (PF) 250 MCG/5ML IJ SOLN
INTRAMUSCULAR | Status: AC
  Filled 2019-03-21: qty 5

## 2019-03-21 MED ORDER — PHENYLEPHRINE 40 MCG/ML (10ML) SYRINGE FOR IV PUSH (FOR BLOOD PRESSURE SUPPORT)
PREFILLED_SYRINGE | INTRAVENOUS | Status: DC | PRN
  Administered 2019-03-21 (×2): 40 ug via INTRAVENOUS

## 2019-03-21 MED ORDER — ONDANSETRON HCL 4 MG/2ML IJ SOLN
INTRAMUSCULAR | Status: DC | PRN
  Administered 2019-03-21: 4 mg via INTRAVENOUS

## 2019-03-21 MED ORDER — FENTANYL CITRATE (PF) 100 MCG/2ML IJ SOLN
25.0000 ug | INTRAMUSCULAR | Status: DC | PRN
  Administered 2019-03-21: 12.5 ug via INTRAVENOUS

## 2019-03-21 MED ORDER — 0.9 % SODIUM CHLORIDE (POUR BTL) OPTIME
TOPICAL | Status: DC | PRN
  Administered 2019-03-21: 1000 mL

## 2019-03-21 MED ORDER — EPHEDRINE 5 MG/ML INJ
INTRAVENOUS | Status: AC
  Filled 2019-03-21: qty 20

## 2019-03-21 MED ORDER — CEFAZOLIN SODIUM-DEXTROSE 2-4 GM/100ML-% IV SOLN
2.0000 g | INTRAVENOUS | Status: AC
  Administered 2019-03-21: 2 g via INTRAVENOUS

## 2019-03-21 MED ORDER — MIDAZOLAM HCL 5 MG/5ML IJ SOLN
INTRAMUSCULAR | Status: DC | PRN
  Administered 2019-03-21 (×2): 1 mg via INTRAVENOUS

## 2019-03-21 MED ORDER — SUGAMMADEX SODIUM 200 MG/2ML IV SOLN
INTRAVENOUS | Status: DC | PRN
  Administered 2019-03-21: 200 mg via INTRAVENOUS

## 2019-03-21 MED ORDER — LACTATED RINGERS IV SOLN
INTRAVENOUS | Status: DC
  Administered 2019-03-21 (×2): via INTRAVENOUS

## 2019-03-21 MED ORDER — BUPIVACAINE-EPINEPHRINE (PF) 0.5% -1:200000 IJ SOLN
INTRAMUSCULAR | Status: DC | PRN
  Administered 2019-03-21: 24 mL

## 2019-03-21 MED ORDER — DEXAMETHASONE SODIUM PHOSPHATE 10 MG/ML IJ SOLN
INTRAMUSCULAR | Status: DC | PRN
  Administered 2019-03-21: 10 mg via INTRAVENOUS

## 2019-03-21 MED ORDER — ROCURONIUM BROMIDE 100 MG/10ML IV SOLN
INTRAVENOUS | Status: DC | PRN
  Administered 2019-03-21: 50 mg via INTRAVENOUS

## 2019-03-21 MED ORDER — GABAPENTIN 300 MG PO CAPS
300.0000 mg | ORAL_CAPSULE | ORAL | Status: AC
  Administered 2019-03-21: 12:00:00 300 mg via ORAL

## 2019-03-21 MED ORDER — FENTANYL CITRATE (PF) 100 MCG/2ML IJ SOLN
INTRAMUSCULAR | Status: AC
  Filled 2019-03-21: qty 2

## 2019-03-21 MED ORDER — SODIUM CHLORIDE 0.9 % IV SOLN
INTRAVENOUS | Status: DC | PRN
  Administered 2019-03-21: 14:00:00 100 mL

## 2019-03-21 MED ORDER — MIDAZOLAM HCL 2 MG/2ML IJ SOLN
INTRAMUSCULAR | Status: AC
  Filled 2019-03-21: qty 2

## 2019-03-21 MED ORDER — PROMETHAZINE HCL 25 MG/ML IJ SOLN
INTRAMUSCULAR | Status: AC
  Filled 2019-03-21: qty 1

## 2019-03-21 MED ORDER — FENTANYL CITRATE (PF) 250 MCG/5ML IJ SOLN
INTRAMUSCULAR | Status: DC | PRN
  Administered 2019-03-21 (×3): 25 ug via INTRAVENOUS
  Administered 2019-03-21: 75 ug via INTRAVENOUS
  Administered 2019-03-21 (×4): 25 ug via INTRAVENOUS

## 2019-03-21 MED ORDER — ACETAMINOPHEN 500 MG PO TABS: 1000.0000 mg | ORAL_TABLET | Freq: Once | ORAL | Status: DC

## 2019-03-21 MED ORDER — OXYCODONE HCL 5 MG/5ML PO SOLN: 5.0000 mg | Freq: Once | ORAL | Status: DC | PRN

## 2019-03-21 MED ORDER — PROPOFOL 10 MG/ML IV BOLUS
INTRAVENOUS | Status: DC | PRN
  Administered 2019-03-21: 140 mg via INTRAVENOUS

## 2019-03-21 MED ORDER — LIDOCAINE 2% (20 MG/ML) 5 ML SYRINGE
INTRAMUSCULAR | Status: DC | PRN
  Administered 2019-03-21 (×2): 40 mg via INTRAVENOUS

## 2019-03-21 MED ORDER — CELECOXIB 200 MG PO CAPS
ORAL_CAPSULE | ORAL | Status: AC
  Administered 2019-03-21: 12:00:00 200 mg via ORAL
  Filled 2019-03-21: qty 1

## 2019-03-21 MED ORDER — CEFAZOLIN SODIUM-DEXTROSE 2-4 GM/100ML-% IV SOLN
INTRAVENOUS | Status: AC
  Filled 2019-03-21: qty 100

## 2019-03-21 SURGICAL SUPPLY — 35 items
APPLIER CLIP 5 13 M/L LIGAMAX5 (MISCELLANEOUS) ×3
BLADE CLIPPER SURG (BLADE) IMPLANT
CANISTER SUCT 3000ML PPV (MISCELLANEOUS) ×3 IMPLANT
CATH REDDICK CHOLANGI 4FR 50CM (CATHETERS) ×3 IMPLANT
CHLORAPREP W/TINT 26 (MISCELLANEOUS) ×3 IMPLANT
CLIP APPLIE 5 13 M/L LIGAMAX5 (MISCELLANEOUS) ×2 IMPLANT
COVER MAYO STAND STRL (DRAPES) ×3 IMPLANT
COVER SURGICAL LIGHT HANDLE (MISCELLANEOUS) ×2 IMPLANT
COVER WAND RF STERILE (DRAPES) ×3 IMPLANT
DERMABOND ADVANCED (GAUZE/BANDAGES/DRESSINGS) ×1
DERMABOND ADVANCED .7 DNX12 (GAUZE/BANDAGES/DRESSINGS) ×2 IMPLANT
DRAPE C-ARM 42X120 X-RAY (DRAPES) ×3 IMPLANT
ELECT REM PT RETURN 9FT ADLT (ELECTROSURGICAL) ×3
ELECTRODE REM PT RTRN 9FT ADLT (ELECTROSURGICAL) ×2 IMPLANT
GLOVE BIO SURGEON STRL SZ7.5 (GLOVE) ×3 IMPLANT
GOWN STRL REUS W/ TWL LRG LVL3 (GOWN DISPOSABLE) ×6 IMPLANT
GOWN STRL REUS W/TWL LRG LVL3 (GOWN DISPOSABLE) ×4
IV CATH 14GX2 1/4 (CATHETERS) ×4 IMPLANT
KIT BASIN OR (CUSTOM PROCEDURE TRAY) ×3 IMPLANT
KIT TURNOVER KIT B (KITS) ×3 IMPLANT
NS IRRIG 1000ML POUR BTL (IV SOLUTION) ×3 IMPLANT
PAD ARMBOARD 7.5X6 YLW CONV (MISCELLANEOUS) ×3 IMPLANT
POUCH SPECIMEN RETRIEVAL 10MM (ENDOMECHANICALS) ×3 IMPLANT
SCISSORS LAP 5X35 DISP (ENDOMECHANICALS) ×3 IMPLANT
SET IRRIG TUBING LAPAROSCOPIC (IRRIGATION / IRRIGATOR) ×3 IMPLANT
SET TUBE SMOKE EVAC HIGH FLOW (TUBING) ×3 IMPLANT
SLEEVE ENDOPATH XCEL 5M (ENDOMECHANICALS) ×6 IMPLANT
SPECIMEN JAR SMALL (MISCELLANEOUS) ×3 IMPLANT
SUT MNCRL AB 4-0 PS2 18 (SUTURE) ×4 IMPLANT
TOWEL GREEN STERILE (TOWEL DISPOSABLE) ×3 IMPLANT
TOWEL GREEN STERILE FF (TOWEL DISPOSABLE) ×3 IMPLANT
TRAY LAPAROSCOPIC MC (CUSTOM PROCEDURE TRAY) ×3 IMPLANT
TROCAR XCEL BLUNT TIP 100MML (ENDOMECHANICALS) ×3 IMPLANT
TROCAR XCEL NON-BLD 5MMX100MML (ENDOMECHANICALS) ×3 IMPLANT
WATER STERILE IRR 1000ML POUR (IV SOLUTION) ×3 IMPLANT

## 2019-03-21 NOTE — Anesthesia Postprocedure Evaluation (Signed)
Anesthesia Post Note  Patient: Megan Brady  Procedure(s) Performed: Laparoscopic Cholecystectomy (N/A Abdomen)     Patient location during evaluation: PACU Anesthesia Type: General Level of consciousness: awake and alert and oriented Pain management: pain level controlled Vital Signs Assessment: post-procedure vital signs reviewed and stable Respiratory status: spontaneous breathing, nonlabored ventilation and respiratory function stable Cardiovascular status: blood pressure returned to baseline Postop Assessment: no apparent nausea or vomiting Anesthetic complications: no    Last Vitals:  Vitals:   03/21/19 1548 03/21/19 1603  BP: 102/69 122/83  Pulse: 96 77  Resp: 20 20  Temp:    SpO2: 98% 99%    Last Pain:  Vitals:   03/21/19 1530  TempSrc:   PainSc: [place]

## 2019-03-21 NOTE — Anesthesia Preprocedure Evaluation (Addendum)
Anesthesia Evaluation  Patient identified by MRN, date of birth, ID band Patient awake    Reviewed: Allergy & Precautions, NPO status , Patient's Chart, lab work & pertinent test results  History of Anesthesia Complications Negative for: history of anesthetic complications  Airway Mallampati: II  TM Distance: >3 FB Neck ROM: Full    Dental no notable dental hx.    Pulmonary asthma , Current Smoker and Patient abstained from smoking.,    Pulmonary exam normal        Cardiovascular negative cardio ROS Normal cardiovascular exam     Neuro/Psych negative neurological ROS  negative psych ROS   GI/Hepatic Neg liver ROS, GERD  Controlled,  Endo/Other  negative endocrine ROS  Renal/GU negative Renal ROS  negative genitourinary   Musculoskeletal negative musculoskeletal ROS (+)   Abdominal   Peds  Hematology negative hematology ROS (+)   Anesthesia Other Findings Day of surgery medications reviewed with patient.  Reproductive/Obstetrics negative OB ROS                            Anesthesia Physical Anesthesia Plan  ASA: II  Anesthesia Plan: General   Post-op Pain Management:    Induction: Intravenous  PONV Risk Score and Plan: 4 or greater and Treatment may vary due to age or medical condition, Scopolamine patch - Pre-op, Ondansetron, Dexamethasone and Midazolam  Airway Management Planned: Oral ETT  Additional Equipment: None  Intra-op Plan:   Post-operative Plan: Extubation in OR  Informed Consent: I have reviewed the patients History and Physical, chart, labs and discussed the procedure including the risks, benefits and alternatives for the proposed anesthesia with the patient or authorized representative who has indicated his/her understanding and acceptance.     Dental advisory given  Plan Discussed with: CRNA  Anesthesia Plan Comments:        Anesthesia Quick  Evaluation

## 2019-03-21 NOTE — H&P (Signed)
Megan Brady  Location: [place] Office Patient #: [id number] DOB: 24-Jul-1995 Married / Language: Undefined / Race: White Female   History of Present Illness The patient is a 23 year old female who presents with abdominal pain. we are asked to see the patient in consultation by Dr. Meridee Score to evaluate her for gallstones. The patient is a 23 year old white female who has been experiencing right upper quadrant pain for the last couple years. She has abdominal discomfort about 2-3 times a month. She does report that certain foods tend to make it worse. The pain is associated with nausea and occasional vomiting. She also reports significant loose stools. She was evaluated with ultrasound and MRCP and was found to have gallstones but no gallbladder wall thickening or ductal dilatation. Her liver functions were essentially normal. She does smoke about a half of a pack of cigarettes a day   Past Surgical History  Oral Surgery   Diagnostic Studies History  Colonoscopy  never Mammogram  1-3 years ago Pap Smear  1-5 years ago  Allergies  No Known Drug Allergies   Medication History Albuterol (90MCG/ACT Aerosol Soln, Inhalation) Active. Zofran (4MG  Tablet, Oral) Active. Omeprazole (10MG  Capsule DR, Oral) Active. Medications Reconciled  Social History  Alcohol use  Occasional alcohol use. Caffeine use  Carbonated beverages, Coffee. No drug use  Tobacco use  Current every day smoker.  Family History  Heart disease in female family member before age 94  Hypertension  Mother. Migraine Headache  Mother. Seizure disorder  Sister. Thyroid problems  Mother.  Pregnancy / Birth History  Age at menarche  13 years. Irregular periods   Other Problems Anxiety Disorder  Asthma  Cholelithiasis  Gastroesophageal Reflux Disease  Kidney Stone  Migraine Headache     Review of Systems  General Present- Appetite Loss, Chills, Fatigue and Weight Gain. Not  Present- Fever, Night Sweats and Weight Loss. Skin Not Present- Change in Wart/Mole, Dryness, Hives, Jaundice, New Lesions, Non-Healing Wounds, Rash and Ulcer. HEENT Present- Seasonal Allergies and Wears glasses/contact lenses. Not Present- Earache, Hearing Loss, Hoarseness, Nose Bleed, Oral Ulcers, Ringing in the Ears, Sinus Pain, Sore Throat, Visual Disturbances and Yellow Eyes. Respiratory Present- Snoring. Not Present- Bloody sputum, Chronic Cough, Difficulty Breathing and Wheezing. Cardiovascular Present- Leg Cramps and Swelling of Extremities. Not Present- Chest Pain, Difficulty Breathing Lying Down, Palpitations, Rapid Heart Rate and Shortness of Breath. Gastrointestinal Present- Abdominal Pain, Bloating, Chronic diarrhea, Indigestion and Nausea. Not Present- Bloody Stool, Change in Bowel Habits, Constipation, Difficulty Swallowing, Excessive gas, Gets full quickly at meals, Hemorrhoids, Rectal Pain and Vomiting. Female Genitourinary Not Present- Frequency, Nocturia, Painful Urination, Pelvic Pain and Urgency. Musculoskeletal Present- Muscle Pain and Muscle Weakness. Not Present- Back Pain, Joint Pain, Joint Stiffness and Swelling of Extremities. Neurological Present- Headaches. Not Present- Decreased Memory, Fainting, Numbness, Seizures, Tingling, Tremor, Trouble walking and Weakness. Psychiatric Present- Anxiety and Change in Sleep Pattern. Not Present- Bipolar, Depression, Fearful and Frequent crying. Endocrine Not Present- Cold Intolerance, Excessive Hunger, Hair Changes, Heat Intolerance, Hot flashes and New Diabetes. Hematology Not Present- Blood Thinners, Easy Bruising, Excessive bleeding, Gland problems, HIV and Persistent Infections.  Vitals  Weight: 157.13 lb Height: 66in Body Surface Area: 1.81 m Body Mass Index: 25.36 kg/m  Pulse: 94 (Regular)  BP: 114/72 (Sitting, Left Arm, Standard)       Physical Exam General Mental Status-Alert. General  Appearance-Consistent with stated age. Hydration-Well hydrated. Voice-Normal.  Head and Neck Head-normocephalic, atraumatic with no lesions or palpable masses. Trachea-midline. Thyroid Gland Characteristics -  normal size and consistency.  Eye Eyeball - Bilateral-Extraocular movements intact. Sclera/Conjunctiva - Bilateral-No scleral icterus.  Chest and Lung Exam Chest and lung exam reveals -quiet, even and easy respiratory effort with no use of accessory muscles and on auscultation, normal breath sounds, no adventitious sounds and normal vocal resonance. Inspection Chest Wall - Normal. Back - normal.  Cardiovascular Cardiovascular examination reveals -normal heart sounds, regular rate and rhythm with no murmurs and normal pedal pulses bilaterally.  Abdomen Inspection Inspection of the abdomen reveals - No Hernias. Skin - Scar - no surgical scars. Palpation/Percussion Palpation and Percussion of the abdomen reveal - Soft, Non Tender, No Rebound tenderness, No Rigidity (guarding) and No hepatosplenomegaly. Auscultation Auscultation of the abdomen reveals - Bowel sounds normal.  Neurologic Neurologic evaluation reveals -alert and oriented x 3 with no impairment of recent or remote memory. Mental Status-Normal.  Musculoskeletal Normal Exam - Left-Upper Extremity Strength Normal and Lower Extremity Strength Normal. Normal Exam - Right-Upper Extremity Strength Normal and Lower Extremity Strength Normal.  Lymphatic Head & Neck  General Head & Neck Lymphatics: Bilateral - Description - Normal. Axillary  General Axillary Region: Bilateral - Description - Normal. Tenderness - Non Tender. Femoral & Inguinal  Generalized Femoral & Inguinal Lymphatics: Bilateral - Description - Normal. Tenderness - Non Tender.    Assessment & Plan  GALLSTONES (K80.20) Impression: the patient appears to have symptomatic gallstones. Because of the risk of further  painful episodes and possible pancreatitis of think she would benefit from having her gallbladder removed. I have discussed with her in detail the risks and benefits of the operation as well as some of the technical aspects including the possibility of common duct injury and she understands. She would like to think about it and talk to her family before making any decisions. She will let us know if she wants to schedule surgery.

## 2019-03-21 NOTE — Op Note (Signed)
03/21/2019  2:51 PM  PATIENT:  Megan Brady  22 y.o. female  PRE-OPERATIVE DIAGNOSIS:  GALLSTONES  POST-OPERATIVE DIAGNOSIS:  GALLSTONES  PROCEDURE:  Procedure(s): Laparoscopic Cholecystectomy (N/A)  SURGEON:  Surgeon(s) and Role:    * Griselda Miner, MD - Primary  PHYSICIAN ASSISTANT:   ASSISTANTS: Patrici Ranks, RNFA   ANESTHESIA:   local and general  EBL:  25 mL   BLOOD ADMINISTERED:none  DRAINS: none   LOCAL MEDICATIONS USED:  MARCAINE     SPECIMEN:  Source of Specimen:  gallbladder  DISPOSITION OF SPECIMEN:  PATHOLOGY  COUNTS:  YES  TOURNIQUET:  * No tourniquets in log *  DICTATION: .Dragon Dictation  PLAN OF CARE: Discharge to home after PACU  PATIENT DISPOSITION:  PACU - hemodynamically stable.   Delay start of Pharmacological VTE agent (>24hrs) due to surgical blood loss or risk of bleeding: not applicable  Procedure: After informed consent was obtained the patient was brought to the operating room and placed in the supine position on the operating room table. After adequate induction of general anesthesia the patient's abdomen was prepped with ChloraPrep allowed to dry and draped in usual sterile manner. An appropriate timeout was performed. The area below the umbilicus was infiltrated with quarter percent  Marcaine. A small incision was made with a 15 blade knife. The incision was carried down through the subcutaneous tissue bluntly with a hemostat and [employer] retractors. The linea alba was identified. The linea alba was incised with a 15 blade knife and each side was grasped with Coker clamps. The preperitoneal space was then probed with a hemostat until the peritoneum was opened and access was gained to the abdominal cavity. A 0 Vicryl pursestring stitch was placed in the fascia surrounding the opening. A Hassan cannula was then placed through the opening and anchored in place with the previously placed Vicryl purse string stitch. The abdomen was  insufflated with carbon dioxide without difficulty. A laparoscope was inserted through the [facility] cannula in the right upper quadrant was inspected. Next the epigastric region was infiltrated with % Marcaine. A small incision was made with a 15 blade knife. A 5 mm port was placed bluntly through this incision into the abdominal cavity under direct vision. Next 2 sites were chosen laterally on the right side of the abdomen for placement of 5 mm ports. Each of these areas was infiltrated with quarter percent Marcaine. Small stab incisions were made with a 15 blade knife. 5 mm ports were then placed bluntly through these incisions into the abdominal cavity under direct vision without difficulty. A blunt grasper was placed through the lateralmost 5 mm port and used to grasp the dome of the gallbladder and elevated anteriorly and superiorly. Another blunt grasper was placed through the other 5 mm port and used to retract the body and neck of the gallbladder. A dissector was placed through the epigastric port and using the electrocautery the peritoneal reflection at the gallbladder neck was opened. Blunt dissection was then carried out in this area until the gallbladder neck-cystic duct junction was readily identified and a good window was created. A single clip was placed on the gallbladder neck. A small  ductotomy was made just below the clip with laparoscopic scissors. A 14-gauge Angiocath was then placed through the anterior abdominal wall under direct vision. The catheter would not feed into the cystic duct but I could see the anatomy well and had a good critical window.  The anchoring clip and catheters were then  removed from the patient. 2 clips were placed proximally on the cystic duct and the duct was divided between the 2 sets of clips. Posterior to this the cystic artery was identified and again dissected bluntly in a circumferential manner until a good window  was created. 2 clips were placed proximally and  one distally on the artery and the artery was divided between the 2 sets of clips. Next a laparoscopic hook cautery device was used to separate the gallbladder from the liver bed. Prior to completely detaching the gallbladder from the liver bed the liver bed was inspected and several small bleeding points were coagulated with the electrocautery until the area was completely hemostatic. The gallbladder was then detached the rest of it from the liver bed without difficulty. A laparoscopic bag was inserted through the hassan port. The laparoscope was moved to the epigastric port. The gallbladder was placed within the bag and the bag was sealed.  The bag with the gallbladder was then removed with the [facility] cannula through the infraumbilical port without difficulty. The fascial defect was then closed with the previously placed Vicryl pursestring stitch as well as with another figure-of-eight 0 Vicryl stitch. The liver bed was inspected again and found to be hemostatic. The abdomen was irrigated with copious amounts of saline until the effluent was clear. The ports were then removed under direct vision without difficulty and were found to be hemostatic. The gas was allowed to escape. The skin incisions were all closed with interrupted 4-0 Monocryl subcuticular stitches. Dermabond dressings were applied. The patient tolerated the procedure well. At the end of the case all needle sponge and instrument counts were correct. The patient was then awakened and taken to recovery in stable condition

## 2019-03-21 NOTE — Anesthesia Procedure Notes (Signed)
Procedure Name: Intubation Date/Time: 03/21/2019 1:26 PM Performed by: Candis Shine, CRNA Pre-anesthesia Checklist: Patient identified, Emergency Drugs available, Suction available and Patient being monitored Patient Re-evaluated:Patient Re-evaluated prior to induction Oxygen Delivery Method: Circle System Utilized Preoxygenation: Pre-oxygenation with 100% oxygen Induction Type: IV induction Ventilation: Mask ventilation without difficulty Laryngoscope Size: Mac and 3 Grade View: Grade I Tube type: Oral Number of attempts: 1 Airway Equipment and Method: Stylet Placement Confirmation: ETT inserted through vocal cords under direct vision,  positive ETCO2 and breath sounds checked- equal and bilateral Secured at: 22 cm Tube secured with: Tape Dental Injury: Teeth and Oropharynx as per pre-operative assessment

## 2019-03-21 NOTE — Transfer of Care (Signed)
Immediate Anesthesia Transfer of Care Note  Patient: Jamiaya Bina  Procedure(s) Performed: Laparoscopic Cholecystectomy (N/A Abdomen)  Patient Location: PACU  Anesthesia Type:General  Level of Consciousness: drowsy  Airway & Oxygen Therapy: Patient Spontanous Breathing and Patient connected to face mask oxygen  Post-op Assessment: Report given to RN and Post -op Vital signs reviewed and stable  Post vital signs: Reviewed  Last Vitals:  Vitals Value Taken Time  BP 116/71 03/21/19 1503  Temp    Pulse 105 03/21/19 1506  Resp 11 03/21/19 1506  SpO2 100 % 03/21/19 1506  Vitals shown include unvalidated device data.  Last Pain:  Vitals:   03/21/19 1130  TempSrc:   PainSc: 0-No pain      Patients Stated Pain Goal: 3 (59/97/74 1423)  Complications: No apparent anesthesia complications

## 2019-03-21 NOTE — Interval H&P Note (Signed)
History and Physical Interval Note:  03/21/2019 1:06 PM  Megan Brady  has presented today for surgery, with the diagnosis of GALLSTONES.  The various methods of treatment have been discussed with the patient and family. After consideration of risks, benefits and other options for treatment, the patient has consented to  Procedure(s): LAPAROSCOPIC CHOLECYSTECTOMY WITH INTRAOPERATIVE CHOLANGIOGRAM (N/A) as a surgical intervention.  The patient's history has been reviewed, patient examined, no change in status, stable for surgery.  I have reviewed the patient's chart and labs.  Questions were answered to the patient's satisfaction.     [name]

## 2019-03-22 ENCOUNTER — Encounter (HOSPITAL_COMMUNITY): Payer: Self-pay | Admitting: General Surgery

## 2019-03-22 LAB — SURGICAL PATHOLOGY

## 2019-05-09 ENCOUNTER — Ambulatory Visit (INDEPENDENT_AMBULATORY_CARE_PROVIDER_SITE_OTHER): Admitting: Primary Care

## 2019-05-09 ENCOUNTER — Other Ambulatory Visit: Payer: Self-pay

## 2019-05-09 DIAGNOSIS — G479 Sleep disorder, unspecified: Secondary | ICD-10-CM | POA: Insufficient documentation

## 2019-05-09 MED ORDER — TRAZODONE HCL 50 MG PO TABS: 25.0000 mg | ORAL_TABLET | Freq: Every evening | ORAL | 0 refills | Status: DC | PRN

## 2019-05-09 NOTE — Patient Instructions (Signed)
You can try Calm Aid to help with anxiety/sleep at night.  I sent Trazodone 50 mg tablets for sleep, start with 1/2 tablet about 30 minutes prior to bedtime.  Please update me if your anxiety continues to be bothersome.  It was a pleasure to see you today! Allie Bossier, NP-

## 2019-05-09 NOTE — Progress Notes (Signed)
Subjective:    Patient ID: Megan Brady, female    DOB: 12/03/1995, 23 y.o.   MRN: [id number]  HPI  Virtual Visit via Video Note  I connected with Megan Brady on 05/09/19 at  7:40 AM EST by a video enabled telemedicine application and verified that I am speaking with the correct person using two identifiers.  Location: Patient: Home Provider: Office   I discussed the limitations of evaluation and management by telemedicine and the availability of in person appointments. The patient expressed understanding and agreed to proceed.  History of Present Illness:  Megan Brady is a 23 year old female with a history of asthma, GERD, chronic diarrhea, tachycardia who presents today with a chief complaint of insomnia.  She's having difficulty falling and staying asleep. She will lay in bed for about one hour, then will wake 5-6 times during the night with mind racing thoughts. Symptoms began about 6 weeks ago.   She does have anxiety symptoms during the day which include irritability, easily stressed, feeling anxious. These symptoms were increased recently due to a new job and stress with a friend about one week ago. She's had chronic anxiety for which she's always been able to manage without treatment. She feels that her anxiety will decrease once she's adapted to her new job.  She took "sleep aid" OTC from the dollar store for about one month without improvement, so she then tried Melatonin. Melatonin was initially effective but then became ineffective. She denies difficulty managing anxiety, changes in diet except for the better since gall bladder surgery, increased caffeine (last dose around 12 pm), watching TV, playing on her phone/tablet prior to bed.  She was once managed on Trazodone (unsure of the dose) several years ago by her prior PCP with improvement. She was not diagnosed with anxiety at that time.    Observations/Objective:  Alert and oriented. Appears well, not  sickly. No distress. Speaking in complete sentences.   Assessment and Plan:  Suspect anxiety to be playing some role, not clear if this is really bothering her during the day or becoming something she can't manage. She has done well on Trazodone in the past so I feel more comfortable with initiating, especially since she has failed OTC products.  Discussed anxiety and its role with sleep disturbance, she will update if anxiety becomes difficult to manage. We did discuss Calm Aid OTC which may help with anxiety and insomnia. We will also send a low dose Trazodone Rx for her to try. She will update in one week.  Follow Up Instructions:  You can try Calm Aid to help with anxiety/sleep at night.  I sent Trazodone 50 mg tablets for sleep, start with 1/2 tablet about 30 minutes prior to bedtime.  Please update me if your anxiety continues to be bothersome.  It was a pleasure to see you today! Mayra ReelKate Lynsee Wands, NP-C    I discussed the assessment and treatment plan with the patient. The patient was provided an opportunity to ask questions and all were answered. The patient agreed with the plan and demonstrated an understanding of the instructions.   The patient was advised to call back or seek an in-person evaluation if the symptoms worsen or if the condition fails to improve as anticipated.   This visit occurred during the SARS-CoV-2 public health emergency.  Safety protocols were in place, including screening questions prior to the visit, additional usage of staff PPE, and extensive cleaning of exam room while observing appropriate contact  time as indicated for disinfecting solutions.     Pleas Koch, NP    Review of Systems  Respiratory: Negative for shortness of breath.   Cardiovascular: Negative for chest pain and palpitations.  Psychiatric/Behavioral: Positive for sleep disturbance. The patient is nervous/anxious.        Past Medical History:  Diagnosis Date  . Asthma   .  Exercise-induced cramps of lower extremity    exercise induced compartment syndrome  . Family history of adverse reaction to anesthesia    "Father has a hard time waking up"  . Hematuria   . Kidney disease    as a child  . Kidney stones   . Seasonal allergies      Social History   Socioeconomic History  . Marital status: Single    Spouse name: Not on file  . Number of children: Not on file  . Years of education: Not on file  . Highest education level: Not on file  Occupational History  . Not on file  Tobacco Use  . Smoking status: Current Every Day Smoker    Packs/day: 0.25    Types: Cigarettes  . Smokeless tobacco: Never Used  Substance and Sexual Activity  . Alcohol use: Yes    Comment: seldom  . Drug use: No  . Sexual activity: Yes  Other Topics Concern  . Not on file  Social History Narrative   Married.   No children.   Works in Software engineer.   Enjoys reading, spending time outdoors.    Social Determinants of Health   Financial Resource Strain:   . Difficulty of Paying Living Expenses: Not on file  Food Insecurity:   . Worried About Charity fundraiser in the Last Year: Not on file  . Ran Out of Food in the Last Year: Not on file  Transportation Needs:   . Lack of Transportation (Medical): Not on file  . Lack of Transportation (Non-Medical): Not on file  Physical Activity:   . Days of Exercise per Week: Not on file  . Minutes of Exercise per Session: Not on file  Stress:   . Feeling of Stress : Not on file  Social Connections:   . Frequency of Communication with Friends and Family: Not on file  . Frequency of Social Gatherings with Friends and Family: Not on file  . Attends Religious Services: Not on file  . Active Member of Clubs or Organizations: Not on file  . Attends Archivist Meetings: Not on file  . Marital Status: Not on file  Intimate Partner Violence:   . Fear of Current or Ex-Partner: Not on file  . Emotionally Abused: Not on file    . Physically Abused: Not on file  . Sexually Abused: Not on file    Past Surgical History:  Procedure Laterality Date  . CHOLECYSTECTOMY N/A 03/21/2019   Procedure: Laparoscopic Cholecystectomy;  Surgeon: Jovita Kussmaul, MD;  Location: [facility];  Service: General;  Laterality: N/A;  . WISDOM TOOTH EXTRACTION      Family History  Problem Relation Age of Onset  . Hypertension Mother   . Hypothyroidism Mother   . Lung cancer Maternal Grandmother   . Lung cancer Maternal Aunt   . Diabetes Paternal Grandfather   . Heart disease Paternal Grandfather   . Colon cancer Neg Hx   . Stomach cancer Neg Hx   . AAA (abdominal aortic aneurysm) Neg Hx   . Esophageal cancer Neg Hx   . Inflammatory  bowel disease Neg Hx   . Liver disease Neg Hx   . Pancreatic cancer Neg Hx   . Rectal cancer Neg Hx     Allergies  Allergen Reactions  . Other Shortness Of Breath    Mint flavoring     Current Outpatient Medications on File Prior to Visit  Medication Sig Dispense Refill  . ibuprofen (ADVIL) 200 MG tablet Take 400 mg by mouth every 6 (six) hours as needed for headache or moderate pain.    . medroxyPROGESTERone (DEPO-PROVERA) 150 MG/ML injection Inject 150 mg into the muscle every 3 (three) months.   4  . ondansetron (ZOFRAN-ODT) 4 MG disintegrating tablet Take 1 tablet (4 mg total) by mouth every 8 (eight) hours as needed for nausea. 20 tablet 0   No current facility-administered medications on file prior to visit.    There were no vitals taken for this visit.   Objective:   Physical Exam  Constitutional: She is oriented to person, place, and time. She appears well-nourished.  Respiratory: Effort normal.  Neurological: She is alert and oriented to person, place, and time.  Psychiatric: She has a normal mood and affect.           Assessment & Plan:

## 2019-05-09 NOTE — Assessment & Plan Note (Signed)
Suspect anxiety to be playing some role, not clear if this is really bothering her during the day or becoming something she can't manage. She has done well on Trazodone in the past so I feel more comfortable with initiating, especially since she has failed OTC products.  Discussed anxiety and its role with sleep disturbance, she will update if anxiety becomes difficult to manage. We did discuss Calm Aid OTC which may help with anxiety and insomnia. We will also send a low dose Trazodone Rx for her to try. She will update in one week.

## 2019-06-25 ENCOUNTER — Other Ambulatory Visit: Payer: Self-pay

## 2019-06-25 DIAGNOSIS — G479 Sleep disorder, unspecified: Secondary | ICD-10-CM

## 2019-06-26 MED ORDER — TRAZODONE HCL 50 MG PO TABS: 50.0000 mg | ORAL_TABLET | Freq: Every evening | ORAL | 0 refills | Status: DC | PRN

## 2019-06-26 NOTE — Telephone Encounter (Signed)
Refill sent to pharmacy.   

## 2019-06-26 NOTE — Telephone Encounter (Signed)
Last prescribed on 05/09/2019 . Last appointment on 05/09/2019 . No future appointment

## 2019-07-02 ENCOUNTER — Other Ambulatory Visit: Payer: Self-pay

## 2019-07-02 ENCOUNTER — Telehealth: Payer: Self-pay | Admitting: Unknown Physician Specialty

## 2019-07-02 ENCOUNTER — Telehealth: Payer: Self-pay

## 2019-07-02 ENCOUNTER — Ambulatory Visit
Admission: EM | Admit: 2019-07-02 | Discharge: 2019-07-02 | Disposition: A | Discharge status: Home / Self Care | Attending: Emergency Medicine | Admitting: Emergency Medicine

## 2019-07-02 ENCOUNTER — Encounter: Payer: Self-pay | Admitting: Emergency Medicine

## 2019-07-02 DIAGNOSIS — U071 COVID-19: Secondary | ICD-10-CM

## 2019-07-02 MED ORDER — ALBUTEROL SULFATE HFA 108 (90 BASE) MCG/ACT IN AERS: 2.0000 | INHALATION_SPRAY | RESPIRATORY_TRACT | 0 refills | Status: AC | PRN

## 2019-07-02 MED ORDER — BENZONATATE 100 MG PO CAPS: 100.0000 mg | ORAL_CAPSULE | Freq: Three times a day (TID) | ORAL | 0 refills | Status: DC

## 2019-07-02 MED ORDER — PREDNISONE 20 MG PO TABS: 20.0000 mg | ORAL_TABLET | Freq: Every day | ORAL | 0 refills | Status: AC

## 2019-07-02 MED ORDER — AEROCHAMBER PLUS FLO-VU MEDIUM MISC: 1.0000 | Freq: Once | 0 refills | Status: AC

## 2019-07-02 NOTE — Discharge Instructions (Addendum)
Take steroid with food: no ibuprofen with this.  Tessalon for cough. Start flonase, atrovent nasal spray for nasal congestion/drainage. You can use over the counter nasal saline rinse such as neti pot for nasal congestion. Keep hydrated, your urine should be clear to pale yellow in color. Tylenol/motrin for fever and pain. Monitor for any worsening of symptoms, chest pain, shortness of breath, wheezing, swelling of the throat, go to the emergency department for further evaluation needed.

## 2019-07-02 NOTE — ED Triage Notes (Signed)
Pt presents to [facility] for assessment after testing positive for COVID a few days ago.  C/o cough, shortness of breath, green mucous, loss of taste and smell.  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

## 2019-07-02 NOTE — Telephone Encounter (Signed)
Discussed with patient about Covid symptoms and the use of bamlanivimab, a monoclonal antibody infusion for those with mild to moderate Covid symptoms and at a high risk of hospitalization.  Pt does not meet the FDA criteria for treatment but has been hospitalized x2 with the flu and has asthma.  Will ask the medical director if she could qualify for treatment.

## 2019-07-02 NOTE — Telephone Encounter (Signed)
Patient notified of Kate's comments and recommendations and she verbalized understanding.

## 2019-07-02 NOTE — Telephone Encounter (Signed)
Message left for patient to return my call.  

## 2019-07-02 NOTE — Telephone Encounter (Signed)
[facility] Night - Client TELEPHONE ADVICE RECORD AccessNurse Patient Name: Megan Brady Gender: Female DOB: 1996/04/04 Age: 24 Y 1 M 26 D Return Phone Number: [phone] (Primary), [phone] (Secondary) Address[place] [address] Client [facility] Night - Client Client Site  [facility] [place] - Night Physician Vernona Rieger - NP Contact Type Call Who Is Calling Patient / Member / Family / Caregiver Call Type Triage / Clinical Relationship To Patient Self Return Phone Number [phone] (Primary) Chief Complaint BREATHING - shortness of breath or sounds breathless Reason for Call Symptomatic / Request for Health Information Initial Comment Caller states she tested positive for COVID a few days ago and would like to know what to do. She has diarrhea, headaches, body aches, congestion, short of breath, and has no taste or smell. GOTO Facility Not Listed Fast Med UC Translation No Nurse Assessment Nurse: Carylon Perches, RN, Hilda Lias Date/Time (Eastern Time): 07/01/2019 5:03:14 PM Confirm and document reason for call. If symptomatic, describe symptoms. ---Caller states she tested positive for COVID a few days ago and would like to know what to do. She has diarrhea 4x today, headaches, body aches, congestion, cough, short of breath with exertion, and has no taste or smell. Has the patient had close contact with a person known or suspected to have the novel coronavirus illness OR traveled / lives in area with major community spread (including international travel) in the last 14 days from the onset of symptoms? * If Asymptomatic, screen for exposure and travel within the last 14 days. ---Yes Does the patient have any new or worsening symptoms? ---Yes Will a triage be completed? ---Yes Related visit to physician within the last 2 weeks? ---No Does the PT have any chronic conditions? (i.e.  diabetes, asthma, this includes High risk factors for pregnancy, etc.) ---No Is the patient pregnant or possibly pregnant? (Ask all females between the ages of 66-55) ---No Is this a behavioral health or substance abuse call? ---No PLEASE NOTE: All timestamps contained within this report are represented as [place] Standard Time. CONFIDENTIALTY NOTICE: This fax transmission is intended only for the addressee. It contains information that is legally privileged, confidential or otherwise protected from use or disclosure. If you are not the intended recipient, you are strictly prohibited from reviewing, disclosing, copying using or disseminating any of this information or taking any action in reliance on or regarding this information. If you have received this fax in error, please notify us immediately by telephone so that we can arrange for its return to [place]. Phone: [phone], Toll-Free: [phone], Fax: [phone] Page: 2 of 2 Call Id: [id number] Guidelines Guideline Title Affirmed Question Affirmed Notes Nurse Date/Time Lamount Cohen Time) Coronavirus (COVID-19) - Diagnosed or Suspected MILD difficulty breathing (e.g., minimal/no SOB at rest, SOB with walking, pulse <100) Carylon Perches, RN, Hilda Lias 07/01/2019 5:05:09 PM Disp. Time Lamount Cohen Time) Disposition Final User 07/01/2019 5:01:56 PM Send to Urgent Queue Eugenie Filler 07/01/2019 5:11:22 PM Go to ED Now (or PCP triage) Yes Carylon Perches, RN, Seward Grater Disagree/Comply Comply Caller Understands Yes PreDisposition Did not know what to do Care Advice Given Per Guideline GO TO ED NOW (OR PCP TRIAGE): * IF NO PCP (PRIMARY CARE PROVIDER) SECOND-LEVEL TRIAGE: You need to be seen within the next hour. Go to the ED/UCC at _____________ Hospital. Leave as soon as you can. GENERAL CARE ADVICE FOR COVID-19 SYMPTOMS: CARE ADVICE given per CORONAVIRUS (COVID-19) - DIAGNOSED OR SUSPECTED (Adult) guideline. * The treatment is the same  whether you have COVID-19,  influenza or some other respiratory virus. After Care Instructions Given Call Event Type User Date / Time Description Education document email Stormy Card 07/01/2019 5:14:03 PM Coronavirus (COVID-19) Diagnosis or Suspected Education document email Stormy Card 07/01/2019 5:14:03 PM Coronavirus (COVID-19) Exposure - No Symptoms Education document email Stormy Card 07/01/2019 5:14:03 PM Coronavirus (COVID-19) Prevention Education document email Fredderick Phenix, RN, Lelan Pons 07/01/2019 5:14:03 PM Coronavirus or Influenza - How to Tell Education document email Fredderick Phenix, RNLelan Pons 07/01/2019 5:14:03 PM COVID-19 Bridget Hartshorn Education document email Fredderick Phenix, RN, Lelan Pons 07/01/2019 5:14:03 PM What To Do If You Are Sick With [place] Education document email Fredderick Phenix, RN, Lelan Pons 07/01/2019 5:14:03 PM Coughs and Colds_Medicines or Home Remedies Education document email Stormy Card 07/01/2019 5:14:04 PM Drinking Fluids During Illness Referrals GO TO FACILITY OTHER - SPECIFY

## 2019-07-02 NOTE — ED Notes (Signed)
Patient able to ambulate independently  

## 2019-07-02 NOTE — Telephone Encounter (Signed)
Please notify patient that those symptoms are characteristic of Covid-19. Needs to quarantine 10 days from symptom onset. Tylenol for fevers, body aches, headaches, etc. Delsym for cough.  Can come out of quarantine after 10 days if symptoms have nearly resolved and she is afebrile 24 hours without fever reducing medication.

## 2019-07-02 NOTE — ED Provider Notes (Signed)
[name]    CSN: [id number] Arrival date & time: 07/02/19  1228      History   Chief Complaint Chief Complaint  Patient presents with  . COVID Positive    HPI Megan Brady is a 24 y.o. female with history of asthma presenting for worsening Covid symptoms since diagnosis on 2/5.  Had PCR testing done at [facility].  Patient endorsing productive cough, dyspnea on exertion, loss of smell and taste.  Patient denies hemoptysis, chest pain, fever, myalgias or arthralgias.  States she has been using ibuprofen for symptoms with some relief.  Of note, patient has not used an inhaler since high school as she was told she had exercise-induced asthma.   Past Medical History:  Diagnosis Date  . Asthma   . Exercise-induced cramps of lower extremity    exercise induced compartment syndrome  . Family history of adverse reaction to anesthesia    "Father has a hard time waking up"  . Hematuria   . Kidney disease    as a child  . Kidney stones   . Seasonal allergies     Patient Active Problem List   Diagnosis Date Noted  . Sleep disturbance 05/09/2019  . Tachycardia 01/10/2019  . Family history of thyroid disease 01/10/2019  . Cough 01/10/2019  . Dilated bile duct 05/05/2018  . Chronic diarrhea 05/05/2018  . Abnormal LFTs 05/05/2018  . RUQ abdominal pain 05/05/2018  . Gastroesophageal reflux disease 05/02/2018  . Abdominal cramping 12/04/2017  . Mild intermittent asthma without complication 08/23/2017  . Hematuria 08/23/2017    Past Surgical History:  Procedure Laterality Date  . CHOLECYSTECTOMY N/A 03/21/2019   Procedure: Laparoscopic Cholecystectomy;  Surgeon: Griselda Miner, MD;  Location: [facility] OR;  Service: General;  Laterality: N/A;  . WISDOM TOOTH EXTRACTION      OB History   No obstetric history on file.      Home Medications    Prior to Admission medications   Medication Sig Start Date End Date Taking? Authorizing Provider  albuterol (VENTOLIN HFA)  108 (90 Base) MCG/ACT inhaler Inhale 2 puffs into the lungs every 4 (four) hours as needed for wheezing or shortness of breath. 07/02/19   Hall-Potvin, [place], PA-C  benzonatate (TESSALON) 100 MG capsule Take 1 capsule (100 mg total) by mouth every 8 (eight) hours. 07/02/19   Hall-Potvin, [place], PA-C  ibuprofen (ADVIL) 200 MG tablet Take 400 mg by mouth every 6 (six) hours as needed for headache or moderate pain.    [provider]  medroxyPROGESTERone (DEPO-PROVERA) 150 MG/ML injection Inject 150 mg into the muscle every 3 (three) months.  03/30/18   [provider]  ondansetron (ZOFRAN-ODT) 4 MG disintegrating tablet Take 1 tablet (4 mg total) by mouth every 8 (eight) hours as needed for nausea. 01/10/19   Doreene Nest, NP  predniSONE (DELTASONE) 20 MG tablet Take 1 tablet (20 mg total) by mouth daily for 7 days. 07/02/19 07/09/19  Hall-Potvin, [place], PA-C  Spacer/Aero-Holding Chambers (AEROCHAMBER PLUS FLO-VU MEDIUM) MISC 1 each by Other route once for 1 dose. 07/02/19 07/02/19  Hall-Potvin, [place], PA-C  traZODone (DESYREL) 50 MG tablet Take 1 tablet (50 mg total) by mouth at bedtime as needed for sleep. 06/26/19   Doreene Nest, NP    Family History Family History  Problem Relation Age of Onset  . Hypertension Mother   . Hypothyroidism Mother   . Lung cancer Maternal Grandmother   . Lung cancer Maternal Aunt   .  Diabetes Paternal Grandfather   . Heart disease Paternal Grandfather   . Colon cancer Neg Hx   . Stomach cancer Neg Hx   . AAA (abdominal aortic aneurysm) Neg Hx   . Esophageal cancer Neg Hx   . Inflammatory bowel disease Neg Hx   . Liver disease Neg Hx   . Pancreatic cancer Neg Hx   . Rectal cancer Neg Hx     Social History Social History   Tobacco Use  . Smoking status: Current Every Day Smoker    Packs/day: 0.25    Types: Cigarettes  . Smokeless tobacco: Never Used  Substance Use Topics  . Alcohol use: Yes    Comment: seldom  . Drug use:  No     Allergies   Other   Review of Systems As per HPI   Physical Exam Triage Vital Signs ED Triage Vitals [07/02/19 1237]  Enc Vitals Group     BP 118/81     Pulse Rate (!) 107     Resp 18     Temp 97.8 F (36.6 C)     Temp Source Temporal     SpO2 97 %     Weight      Height      Head Circumference      Peak Flow      Pain Score 4     Pain Loc      Pain Edu?      Excl. in GC?    No data found.  Updated Vital Signs BP 118/81 (BP Location: Left Arm)   Pulse (!) 107   Temp 97.8 F (36.6 C) (Temporal)   Resp 18   SpO2 97%   Visual Acuity Right Eye Distance:   Left Eye Distance:   Bilateral Distance:    Right Eye Near:   Left Eye Near:    Bilateral Near:     Physical Exam Constitutional:      General: She is not in acute distress.    Appearance: She is obese. She is not ill-appearing or diaphoretic.  HENT:     Head: Normocephalic and atraumatic.     Mouth/Throat:     Mouth: Mucous membranes are moist.     Pharynx: Oropharynx is clear. No oropharyngeal exudate or posterior oropharyngeal erythema.  Eyes:     General: No scleral icterus.    Conjunctiva/sclera: Conjunctivae normal.     Pupils: Pupils are equal, round, and reactive to light.  Neck:     Comments: Trachea midline, negative JVD Cardiovascular:     Rate and Rhythm: Normal rate and regular rhythm.     Heart sounds: No murmur. No gallop.   Pulmonary:     Effort: Pulmonary effort is normal. No respiratory distress.     Breath sounds: No wheezing, rhonchi or rales.  Musculoskeletal:        General: Normal range of motion.     Cervical back: Neck supple. No tenderness.     Right lower leg: No edema.     Left lower leg: No edema.  Lymphadenopathy:     Cervical: No cervical adenopathy.  Skin:    Capillary Refill: Capillary refill takes less than 2 seconds.     Coloration: Skin is not jaundiced or pale.     Findings: No rash.  Neurological:     General: No focal deficit present.      Mental Status: She is alert and oriented to person, place, and time.  UC Treatments / Results  Labs (all labs ordered are listed, but only abnormal results are displayed) Labs Reviewed - No data to display  EKG   Radiology No results found.  Procedures Procedures (including critical care time)  Medications Ordered in UC Medications - No data to display  Initial Impression / Assessment and Plan / UC Course  I have reviewed the triage vital signs and the nursing notes.  Pertinent labs & imaging results that were available during my care of the patient were reviewed by me and considered in my medical decision making (see chart for details).     Patient afebrile, nontoxic in office today.  Mildly tachycardic with HR 98-1 05 at bedside.  Will treat supportively as outlined below.  This provider contacted infusion clinic for possible Mab treatment upon discharge with patient's permission.  We will follow-up closely with PCP via virtual/telehealth, or in person if needed for symptoms as outlined below.  Return precautions discussed, patient verbalized understanding and is agreeable to plan. Final Clinical Impressions(s) / UC Diagnoses   Final diagnoses:  [id number] virus infection     Discharge Instructions     Take steroid with food: no ibuprofen with this.  Tessalon for cough. Start flonase, atrovent nasal spray for nasal congestion/drainage. You can use over the counter nasal saline rinse such as neti pot for nasal congestion. Keep hydrated, your urine should be clear to pale yellow in color. Tylenol/motrin for fever and pain. Monitor for any worsening of symptoms, chest pain, shortness of breath, wheezing, swelling of the throat, go to the emergency department for further evaluation needed.     ED Prescriptions    Medication Sig Dispense Auth. Provider   benzonatate (TESSALON) 100 MG capsule Take 1 capsule (100 mg total) by mouth every 8 (eight) hours. 21 capsule  Hall-Potvin, [place], PA-C   predniSONE (DELTASONE) 20 MG tablet Take 1 tablet (20 mg total) by mouth daily for 7 days. 7 tablet Hall-Potvin, [place], PA-C   albuterol (VENTOLIN HFA) 108 (90 Base) MCG/ACT inhaler Inhale 2 puffs into the lungs every 4 (four) hours as needed for wheezing or shortness of breath. 18 g Hall-Potvin, [place], PA-C   Spacer/Aero-Holding Chambers (AEROCHAMBER PLUS FLO-VU MEDIUM) MISC 1 each by Other route once for 1 dose. 1 each Hall-Potvin, [place], PA-C     PDMP not reviewed this encounter.   Hall-Potvin, [place], [place] 07/02/19 1512

## 2019-07-03 ENCOUNTER — Telehealth: Payer: Self-pay | Admitting: Unknown Physician Specialty

## 2019-07-03 NOTE — Telephone Encounter (Signed)
Called to discuss with [name] about Covid symptoms and the use of bamlanivimab, a monoclonal antibody infusion for those with mild to moderate Covid symptoms and at a high risk of hospitalization.      After talking to the medical team, it was determined she does not qualify for this infusion due to lack of identified risk factors and co-morbid conditions.  Symptoms reviewed as well as criteria for ending isolation.  Symptoms reviewed that would warrant ED/Hospital evaluation as well should her condition worsen. Preventative practices reviewed. Patient verbalized understanding.    Patient Active Problem List   Diagnosis Date Noted  . Sleep disturbance 05/09/2019  . Tachycardia 01/10/2019  . Family history of thyroid disease 01/10/2019  . Cough 01/10/2019  . Dilated bile duct 05/05/2018  . Chronic diarrhea 05/05/2018  . Abnormal LFTs 05/05/2018  . RUQ abdominal pain 05/05/2018  . Gastroesophageal reflux disease 05/02/2018  . Abdominal cramping 12/04/2017  . Mild intermittent asthma without complication 08/23/2017  . Hematuria 08/23/2017

## 2019-09-02 ENCOUNTER — Encounter: Payer: Self-pay | Admitting: Primary Care

## 2019-09-02 ENCOUNTER — Ambulatory Visit (INDEPENDENT_AMBULATORY_CARE_PROVIDER_SITE_OTHER): Admitting: Primary Care

## 2019-09-02 ENCOUNTER — Other Ambulatory Visit: Payer: Self-pay

## 2019-09-02 VITALS — BP 118/76 | HR 100 | Temp 97.1°F | Ht 66.0 in | Wt 156.2 lb

## 2019-09-02 DIAGNOSIS — M79A21 Nontraumatic compartment syndrome of right lower extremity: Secondary | ICD-10-CM

## 2019-09-02 DIAGNOSIS — M79A22 Nontraumatic compartment syndrome of left lower extremity: Secondary | ICD-10-CM | POA: Diagnosis not present

## 2019-09-02 MED ORDER — GABAPENTIN 100 MG PO CAPS: 100.0000 mg | ORAL_CAPSULE | Freq: Three times a day (TID) | ORAL | 0 refills | Status: AC

## 2019-09-02 NOTE — Patient Instructions (Signed)
Stop by the front desk and speak with either Shirlee Limerick or Charmaine regarding your referral to orthopedics.  You may take gabapentin 100 mg up to three times daily as needed for pain. This may cause drowsiness.   You could also try topical diclofenac gel. This can be purchased at any drugstore.  It was a pleasure to see you today!

## 2019-09-02 NOTE — Assessment & Plan Note (Addendum)
Diagnosed in 2015 in [place], intermittent over the years. Recently it seems that her condition has progressed and requires further evaluation.  Referral placed to orthopedics for testing and treatment. Will trial low dose gabapentin to use PRN. Drowsiness precautions provided.

## 2019-09-02 NOTE — Progress Notes (Signed)
Subjective:    Patient ID: Megan Brady, female    DOB: 1996/04/15, 24 y.o.   MRN: [id number]  HPI  This visit occurred during the SARS-CoV-2 public health emergency.  Safety protocols were in place, including screening questions prior to the visit, additional usage of staff PPE, and extensive cleaning of exam room while observing appropriate contact time as indicated for disinfecting solutions.   Megan Brady is a 24 year old female with a history of GERD, asthma, exercise induced compartment syndrome, chronic diarrhea who presents today with a chief complaint of lower extremity pain.  Her pain is located to posterior bilateral lower extremities from the distal knee down to ankle. Chronic for years. She was diagnosed with exercise induced compartment syndrome in October 2015 after collapsing on the soccer field. She saw several orthopedic specialists in [place] at the time, diagnosed with compartmental syndrome within the lower extremities, never underwent pressure testing. She completed PT for about 2 weeks, isn't sure if this made a difference. She was told to seize all sports at the time.   Since then her pain has been intermittent and occurs when overworking her legs. Over the years she's been familiar with her limits so she's avoided aggressive activity. Regular walking or mild/moderate exercise didn't cause pain.   A few weeks ago, once the weather became warmer, she stared to notice an increase in pain without exertional activities. She went to [employer] in [place] a few weeks ago and had to fly home due to her pain from walking. Once she returned from [place] she started to notice pain with normal activities.   Pain is present when hiking, walking her dog, exercising. She describes her pain as burning, tingling, throbbing. Pain occurs with light touch and pressure.  She had to call out of work this morning due to her pain. She was told years ago that her symptoms could get  worse in the future. She is taking Ibuprofen during flares without improvement. She's also tried warm baths.   Review of Systems  Musculoskeletal: Positive for arthralgias and myalgias. Negative for joint swelling.  Skin: Negative for color change.  Neurological: Positive for numbness. Negative for weakness.       Past Medical History:  Diagnosis Date  . Asthma   . Exercise-induced cramps of lower extremity    exercise induced compartment syndrome  . Family history of adverse reaction to anesthesia    "Father has a hard time waking up"  . Hematuria   . Kidney disease    as a child  . Kidney stones   . Seasonal allergies      Social History   Socioeconomic History  . Marital status: Single    Spouse name: Not on file  . Number of children: Not on file  . Years of education: Not on file  . Highest education level: Not on file  Occupational History  . Not on file  Tobacco Use  . Smoking status: Current Every Day Smoker    Packs/day: 0.25    Types: Cigarettes  . Smokeless tobacco: Never Used  Substance and Sexual Activity  . Alcohol use: Yes    Comment: seldom  . Drug use: No  . Sexual activity: Yes  Other Topics Concern  . Not on file  Social History Narrative   Married.   No children.   Works in Software engineer.   Enjoys reading, spending time outdoors.    Social Determinants of Health   Financial Resource Strain:   .  Difficulty of Paying Living Expenses:   Food Insecurity:   . Worried About Programme researcher, broadcasting/film/video in the Last Year:   . Barista in the Last Year:   Transportation Needs:   . Freight forwarder (Medical):   Marland Kitchen Lack of Transportation (Non-Medical):   Physical Activity:   . Days of Exercise per Week:   . Minutes of Exercise per Session:   Stress:   . Feeling of Stress :   Social Connections:   . Frequency of Communication with Friends and Family:   . Frequency of Social Gatherings with Friends and Family:   . Attends Religious Services:    . Active Member of Clubs or Organizations:   . Attends Banker Meetings:   Marland Kitchen Marital Status:   Intimate Partner Violence:   . Fear of Current or Ex-Partner:   . Emotionally Abused:   Marland Kitchen Physically Abused:   . Sexually Abused:     Past Surgical History:  Procedure Laterality Date  . CHOLECYSTECTOMY N/A 03/21/2019   Procedure: Laparoscopic Cholecystectomy;  Surgeon: Griselda Miner, MD;  Location: [facility] OR;  Service: General;  Laterality: N/A;  . WISDOM TOOTH EXTRACTION      Family History  Problem Relation Age of Onset  . Hypertension Mother   . Hypothyroidism Mother   . Lung cancer Maternal Grandmother   . Lung cancer Maternal Aunt   . Diabetes Paternal Grandfather   . Heart disease Paternal Grandfather   . Colon cancer Neg Hx   . Stomach cancer Neg Hx   . AAA (abdominal aortic aneurysm) Neg Hx   . Esophageal cancer Neg Hx   . Inflammatory bowel disease Neg Hx   . Liver disease Neg Hx   . Pancreatic cancer Neg Hx   . Rectal cancer Neg Hx     Allergies  Allergen Reactions  . Other Shortness Of Breath    Mint flavoring     Current Outpatient Medications on File Prior to Visit  Medication Sig Dispense Refill  . albuterol (VENTOLIN HFA) 108 (90 Base) MCG/ACT inhaler Inhale 2 puffs into the lungs every 4 (four) hours as needed for wheezing or shortness of breath. 18 g 0  . ibuprofen (ADVIL) 200 MG tablet Take 400 mg by mouth every 6 (six) hours as needed for headache or moderate pain.    . medroxyPROGESTERone (DEPO-PROVERA) 150 MG/ML injection Inject 150 mg into the muscle every 3 (three) months.   4  . ondansetron (ZOFRAN-ODT) 4 MG disintegrating tablet Take 1 tablet (4 mg total) by mouth every 8 (eight) hours as needed for nausea. 20 tablet 0  . traZODone (DESYREL) 50 MG tablet Take 1 tablet (50 mg total) by mouth at bedtime as needed for sleep. 90 tablet 0   No current facility-administered medications on file prior to visit.    BP 118/76   Pulse 100    Temp (!) 97.1 F (36.2 C) (Temporal)   Ht 5\' 6"  (1.676 m)   Wt 156 lb 4 oz (70.9 kg)   SpO2 95%   BMI 25.22 kg/m    Objective:   Physical Exam  Constitutional: She appears well-nourished.  Musculoskeletal:     Comments: Pain with walking to the exam table. Pain with ambulation down hallway. Patellar joints stable. Patellar refluxes 2+. Pain to bilateral calves with dorsal flexion bilaterally.   Skin: Skin is warm and dry. No erythema.           Assessment & Plan:

## 2019-09-04 ENCOUNTER — Ambulatory Visit: Admitting: Primary Care

## 2019-09-09 ENCOUNTER — Other Ambulatory Visit: Payer: Self-pay | Admitting: Primary Care

## 2019-09-09 DIAGNOSIS — M79A21 Nontraumatic compartment syndrome of right lower extremity: Secondary | ICD-10-CM

## 2019-09-09 DIAGNOSIS — M79A22 Nontraumatic compartment syndrome of left lower extremity: Secondary | ICD-10-CM

## 2019-09-13 ENCOUNTER — Encounter: Payer: Self-pay | Admitting: Cardiovascular Disease

## 2019-09-21 ENCOUNTER — Other Ambulatory Visit: Payer: Self-pay | Admitting: Primary Care

## 2019-09-21 DIAGNOSIS — G479 Sleep disorder, unspecified: Secondary | ICD-10-CM

## 2019-10-08 ENCOUNTER — Encounter: Payer: Self-pay | Admitting: Cardiovascular Disease

## 2019-10-08 ENCOUNTER — Other Ambulatory Visit: Payer: Self-pay

## 2019-10-08 ENCOUNTER — Ambulatory Visit (INDEPENDENT_AMBULATORY_CARE_PROVIDER_SITE_OTHER): Admitting: Cardiovascular Disease

## 2019-10-08 VITALS — BP 110/70 | HR 88 | Ht 66.0 in | Wt 159.8 lb

## 2019-10-08 DIAGNOSIS — R000 Tachycardia, unspecified: Secondary | ICD-10-CM

## 2019-10-08 DIAGNOSIS — M79A22 Nontraumatic compartment syndrome of left lower extremity: Secondary | ICD-10-CM | POA: Diagnosis not present

## 2019-10-08 DIAGNOSIS — M79A21 Nontraumatic compartment syndrome of right lower extremity: Secondary | ICD-10-CM

## 2019-10-08 DIAGNOSIS — I739 Peripheral vascular disease, unspecified: Secondary | ICD-10-CM

## 2019-10-08 NOTE — Patient Instructions (Signed)
Medication Instructions:  Your physician recommends that you continue on your current medications as directed. Please refer to the Current Medication list given to you today.  *If you need a refill on your cardiac medications before your next appointment, please call your pharmacy*  Lab Work: You will need a COVID test prior to the Exercise ABI. This is done at [address]   If you have labs (blood work) drawn today and your tests are completely normal, you will receive your results only by: Marland Kitchen MyChart Message (if you have MyChart) OR . A paper copy in the mail If you have any lab test that is abnormal or we need to change your treatment, we will call you to review the results.  Testing/Procedures: Your physician has requested that you have a RESTING EXERCISE ankle brachial index (ABI). During this test an ultrasound and blood pressure cuff are used to evaluate the arteries that supply the arms and legs with blood. Allow thirty minutes for this exam. There are no restrictions or special instructions.   PLEASE SCHEDULE FOR 1 WEEK   Follow-Up: At [facility], you and your health needs are our priority.  As part of our continuing mission to provide you with exceptional heart care, we have created designated Provider Care Teams.  These Care Teams include your primary Cardiologist (physician) and Advanced Practice Providers (APPs -  Physician Assistants and Nurse Practitioners) who all work together to provide you with the care you need, when you need it.  We recommend signing up for the patient portal called "MyChart".  Sign up information is provided on this After Visit Summary.  MyChart is used to connect with patients for Virtual Visits (Telemedicine).  Patients are able to view lab/test results, encounter notes, upcoming appointments, etc.  Non-urgent messages can be sent to your provider as well.   To learn more about what you can do with MyChart, go to [email or web address].     Your next appointment:   AS NEEDED    The format for your next appointment:   In Person  Provider:   Nanetta Batty, MD  Other Instructions

## 2019-10-08 NOTE — Assessment & Plan Note (Signed)
Megan Brady was referred to me by Dr. Thurston Hole for evaluation of bilateral exercise compartment syndrome.  She is had symptoms since 2015.  She has been evaluated by Dr. Farris Has, Dr. Thurston Hole and Dr. Dion Saucier.  She has had MRI of both legs which were unrevealing and bilateral compartment pressure testing which also was unrevealing.  She gives classic symptoms of discomfort and numbness in her legs when she exercises better at rest.  I am going to get rest exercise ABIs to further evaluate.

## 2019-10-08 NOTE — Progress Notes (Signed)
10/08/2019 Megan Brady   1995/10/09  [id number]  Primary Physician Doreene Nest, NP Primary Cardiologist: Runell Gess MD Megan Brady, [place]  HPI:  Megan Brady is a 24 y.o. 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  She was referred to me by Dr. Wyline Mood, her orthopedic surgeon, for vascular evaluation because of exercise-induced compartment syndrome.  She has no cardiovascular risk factors other than smoking a third of a pack a day trying to quit.  She is not diabetic or hyperlipidemic.  There is no family history for heart disease.  Is never had an attack stroke.  She did have COVID-19 in February with symptoms last for 2 and half weeks.  She said discomfort in her leg since 2015 has been diagnosed with bilateral lower extremity compartment syndrome.  She was recently evaluated at [facility] by Drs. Joanna Puff and Dion Saucier.  Her MRI was negative.  Compartment pressure test by Dr. Dion Saucier were unrevealing.   Current Meds  Medication Sig  . albuterol (VENTOLIN HFA) 108 (90 Base) MCG/ACT inhaler Inhale 2 puffs into the lungs every 4 (four) hours as needed for wheezing or shortness of breath.  . gabapentin (NEURONTIN) 100 MG capsule Take 1 capsule (100 mg total) by mouth 3 (three) times daily. As needed for pain.  Marland Kitchen ibuprofen (ADVIL) 200 MG tablet Take 400 mg by mouth every 6 (six) hours as needed for headache or moderate pain.  . medroxyPROGESTERone (DEPO-PROVERA) 150 MG/ML injection Inject 150 mg into the muscle every 3 (three) months.   . ondansetron (ZOFRAN-ODT) 4 MG disintegrating tablet Take 1 tablet (4 mg total) by mouth every 8 (eight) hours as needed for nausea.  . traZODone (DESYREL) 50 MG tablet TAKE 1 TABLET(50 MG) BY MOUTH AT BEDTIME AS NEEDED FOR SLEEP     Allergies  Allergen Reactions  . Other Shortness Of Breath    Mint flavoring     Social History   Socioeconomic History  . Marital status: Single    Spouse name: Not on  file  . Number of children: Not on file  . Years of education: Not on file  . Highest education level: Not on file  Occupational History  . Not on file  Tobacco Use  . Smoking status: Current Every Day Smoker    Packs/day: 0.25    Types: Cigarettes  . Smokeless tobacco: Never Used  Substance and Sexual Activity  . Alcohol use: Yes    Comment: seldom  . Drug use: No  . Sexual activity: Yes  Other Topics Concern  . Not on file  Social History Narrative   Married.   No children.   Works in Furniture conservator/restorer.   Enjoys reading, spending time outdoors.    Social Determinants of Health   Financial Resource Strain:   . Difficulty of Paying Living Expenses:   Food Insecurity:   . Worried About Programme researcher, broadcasting/film/video in the Last Year:   . Barista in the Last Year:   Transportation Needs:   . Freight forwarder (Medical):   Marland Kitchen Lack of Transportation (Non-Medical):   Physical Activity:   . Days of Exercise per Week:   . Minutes of Exercise per Session:   Stress:   . Feeling of Stress :   Social Connections:   . Frequency of Communication with Friends and Family:   . Frequency of Social Gatherings with Friends and Family:   . Attends Religious Services:   .  Active Member of Clubs or Organizations:   . Attends Archivist Meetings:   Marland Kitchen Marital Status:   Intimate Partner Violence:   . Fear of Current or Ex-Partner:   . Emotionally Abused:   Marland Kitchen Physically Abused:   . Sexually Abused:      Review of Systems: General: negative for chills, fever, night sweats or weight changes.  Cardiovascular: negative for chest pain, dyspnea on exertion, edema, orthopnea, palpitations, paroxysmal nocturnal dyspnea or shortness of breath Dermatological: negative for rash Respiratory: negative for cough or wheezing Urologic: negative for hematuria Abdominal: negative for nausea, vomiting, diarrhea, bright red blood per rectum, melena, or hematemesis Neurologic: negative for visual  changes, syncope, or dizziness All other systems reviewed and are otherwise negative except as noted above.    Blood pressure 110/70, pulse 88, height 5\' 6"  (1.676 m), weight 159 lb 12.8 oz (72.5 kg).  General appearance: alert and no distress Neck: no adenopathy, no carotid bruit, no JVD, supple, symmetrical, trachea midline and thyroid not enlarged, symmetric, no tenderness/mass/nodules Lungs: clear to auscultation bilaterally Heart: regular rate and rhythm, S1, S2 normal, no murmur, click, rub or gallop Extremities: extremities normal, atraumatic, no cyanosis or edema Pulses: 2+ and symmetric Skin: Skin color, texture, turgor normal. No rashes or lesions Neurologic: Alert and oriented X 3, normal strength and tone. Normal symmetric reflexes. Normal coordination and gait  EKG sinus rhythm at 88 with nonspecific ST and T wave changes.  I personally reviewed this EKG.  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 Schuessler was referred to me by Dr. Noemi Chapel for evaluation of bilateral exercise compartment syndrome.  She is had symptoms since 2015.  She has been evaluated by Dr. Alfonso Ramus, Dr. Noemi Chapel and Dr. Mardelle Matte.  She has had MRI of both legs which were unrevealing and bilateral compartment pressure testing which also was unrevealing.  She gives classic symptoms of discomfort and numbness in her legs when she exercises better at rest.  I am going to get rest exercise ABIs to further evaluate.      Lorretta Harp MD FACP,FACC,FAHA, [facility] 10/08/2019 3:22 PM

## 2019-10-09 ENCOUNTER — Telehealth: Payer: Self-pay | Admitting: Cardiovascular Disease

## 2019-10-09 NOTE — Telephone Encounter (Signed)
Spoke with patient regarding Exercise lower arterial doppler ordered by Dr. Laretta Alstrom Wednesday 10/16/19 starting at 7:00 am---COVID screening scheduled Saturday 10/12/19 at 9:35 am---advised patient she will have to stay at home until after her studies on Wednesday .  Patient voiced her understanding.

## 2019-10-12 ENCOUNTER — Other Ambulatory Visit (HOSPITAL_COMMUNITY)
Admission: RE | Admit: 2019-10-12 | Discharge: 2019-10-12 | Disposition: A | Discharge status: Home / Self Care | Source: Ambulatory Visit | Attending: Cardiovascular Disease | Admitting: Cardiovascular Disease

## 2019-10-12 DIAGNOSIS — Z01812 Encounter for preprocedural laboratory examination: Secondary | ICD-10-CM | POA: Insufficient documentation

## 2019-10-12 DIAGNOSIS — Z20822 Contact with and (suspected) exposure to covid-19: Secondary | ICD-10-CM | POA: Insufficient documentation

## 2019-10-12 LAB — SARS CORONAVIRUS 2 (TAT 6-24 HRS): SARS Coronavirus 2: NEGATIVE

## 2019-10-16 ENCOUNTER — Inpatient Hospital Stay (HOSPITAL_COMMUNITY): Admission: RE | Admit: 2019-10-16 | Source: Ambulatory Visit

## 2019-10-16 ENCOUNTER — Encounter (HOSPITAL_COMMUNITY)

## 2019-11-01 ENCOUNTER — Encounter (HOSPITAL_COMMUNITY)

## 2019-11-01 ENCOUNTER — Encounter: Admitting: Vascular Surgery

## 2020-04-06 NOTE — Telephone Encounter (Signed)
Will you fill out ppw

## 2020-04-10 ENCOUNTER — Other Ambulatory Visit: Payer: Self-pay | Admitting: Primary Care

## 2020-04-10 DIAGNOSIS — G479 Sleep disorder, unspecified: Secondary | ICD-10-CM

## 2021-04-01 IMAGING — US US THYROID
1 series · 14 of 25 positions shown · non-contrast
Comparison: None.

CLINICAL DATA: Other. 22-year-old female with tachycardia and a
family history of thyroid disease

EXAM:
THYROID ULTRASOUND
TECHNIQUE: Ultrasound examination of the thyroid gland and adjacent soft
tissues was performed.

[Series 1: us thyroid · 0.07mm/px · 14 of 36 slices shown]
[im 1/36]
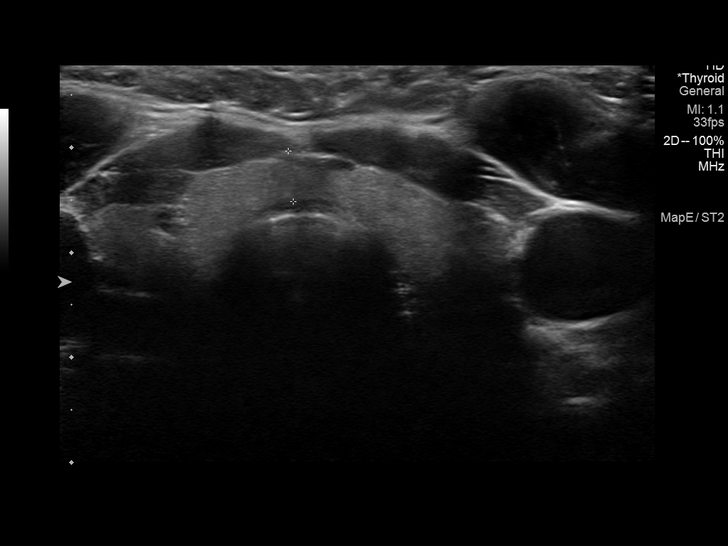
[im 3/36]
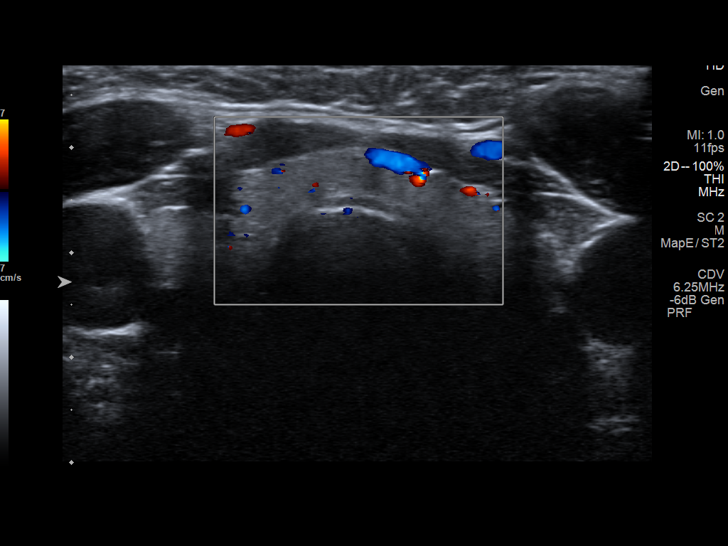
[im 6/36]
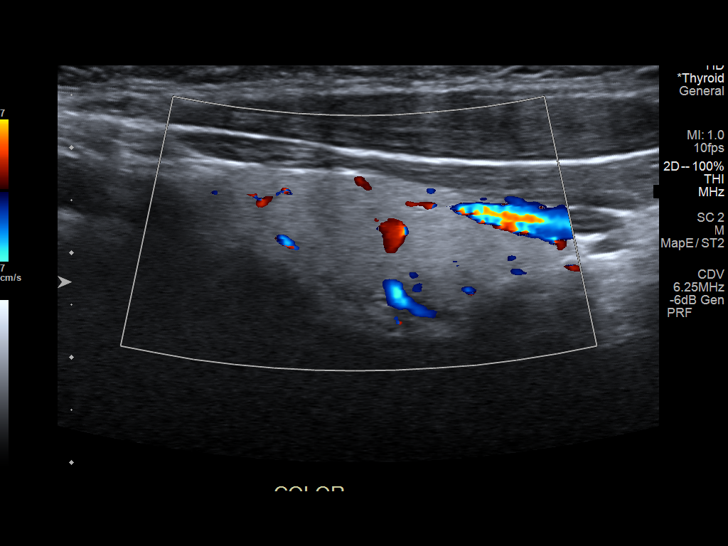
[im 9/36]
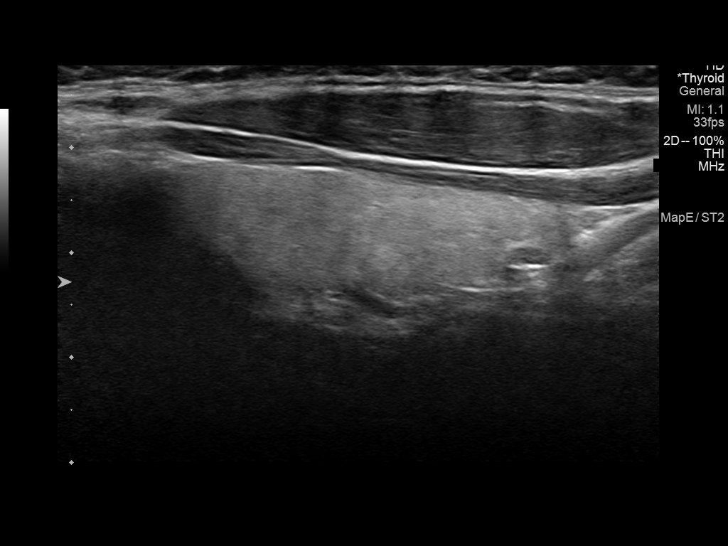
[im 12/36]
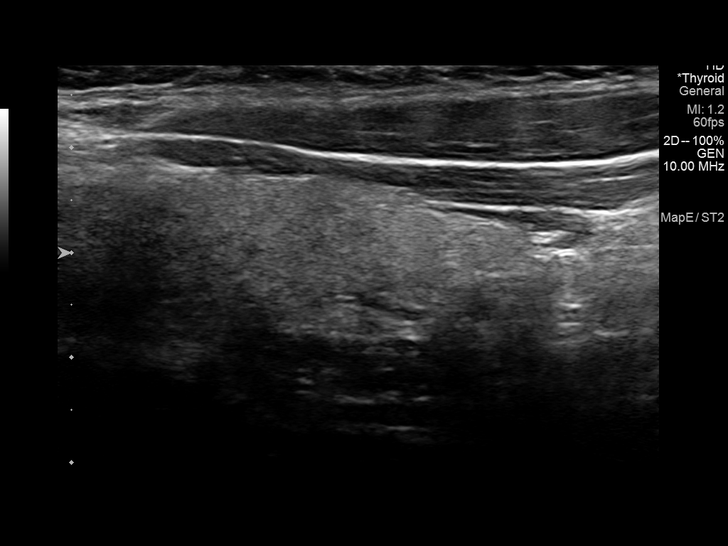
[im 14/36]
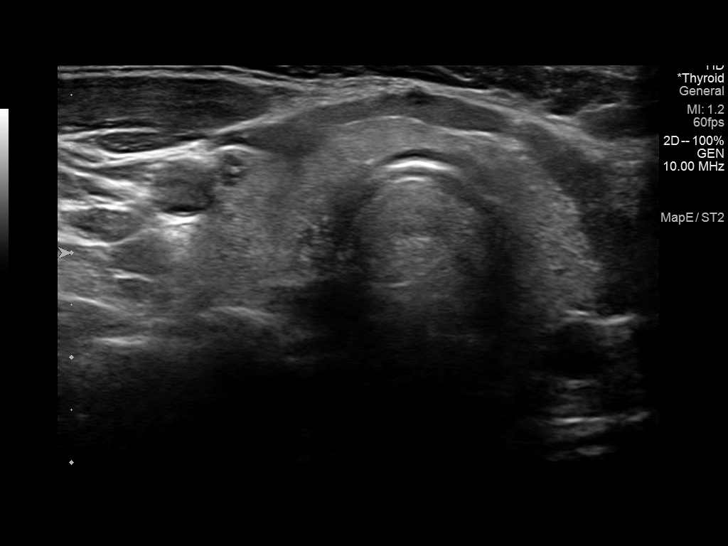
[im 17/36]
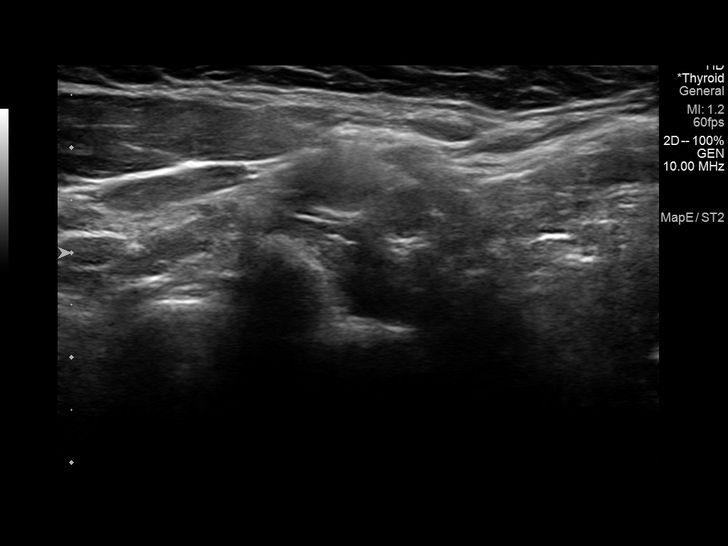
[im 19/36]
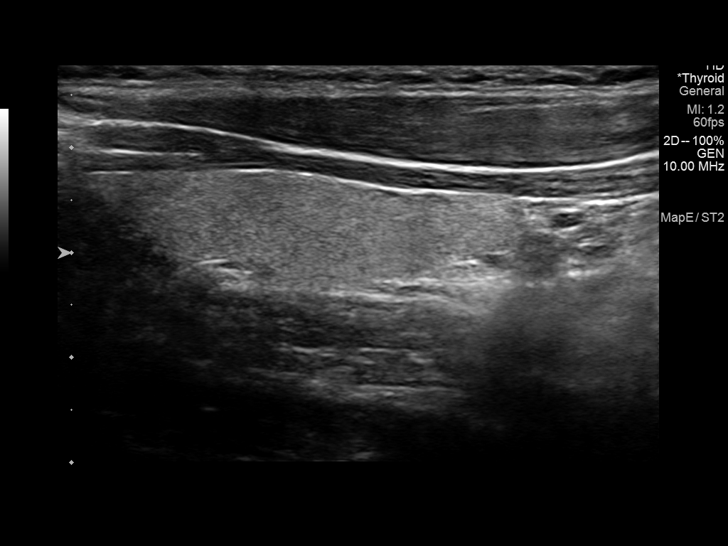
[im 22/36]
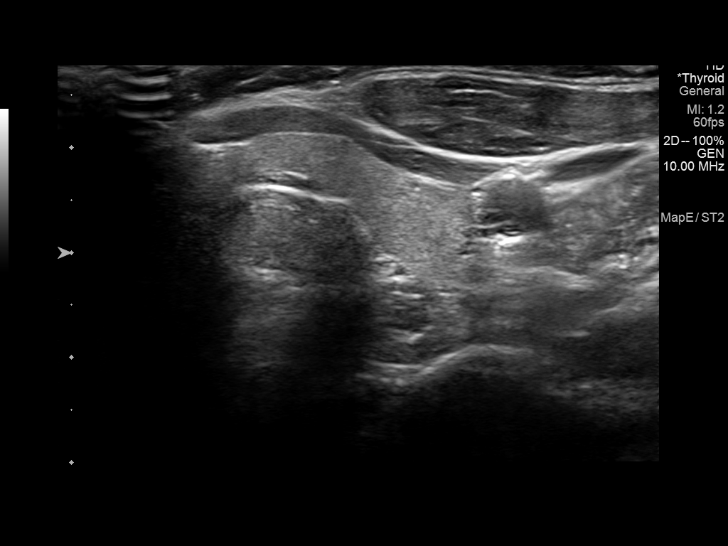
[im 24/36]
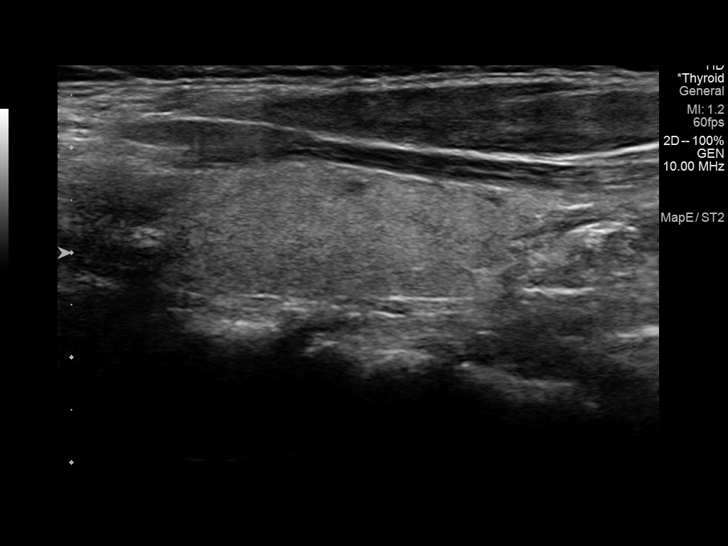
[im 27/36]
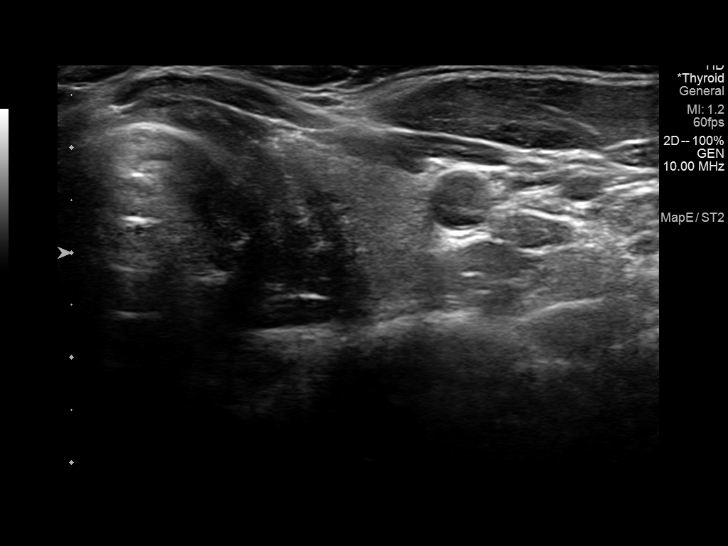
[im 30/36]
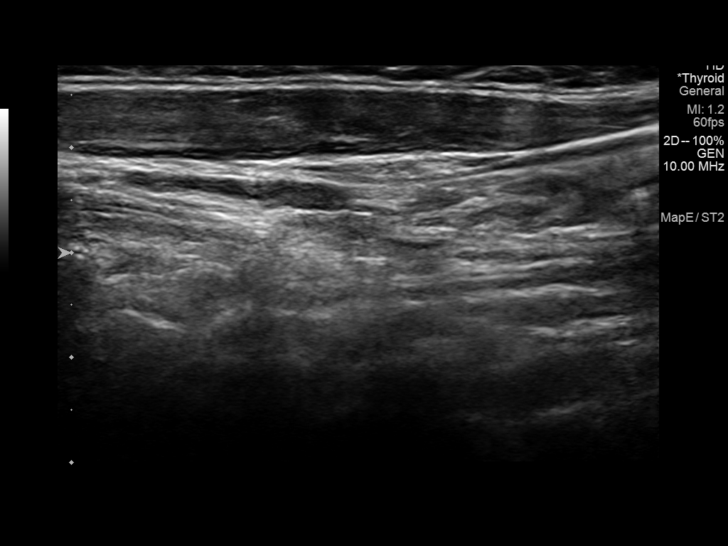
[im 33/36]
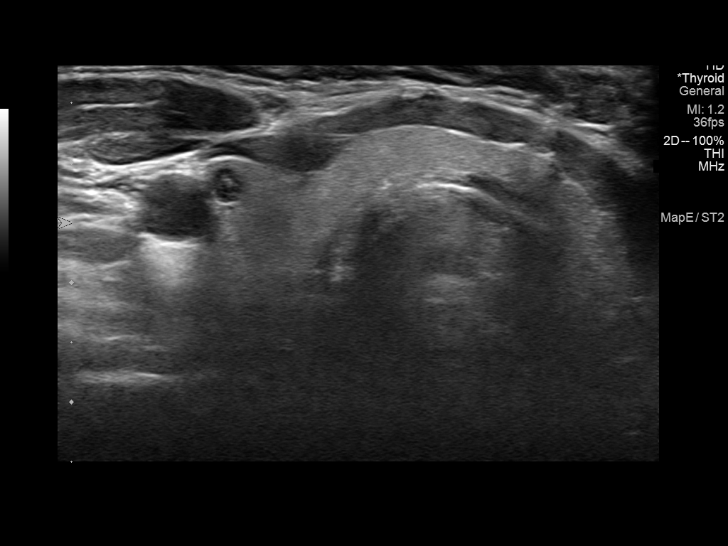
[im 36/36]
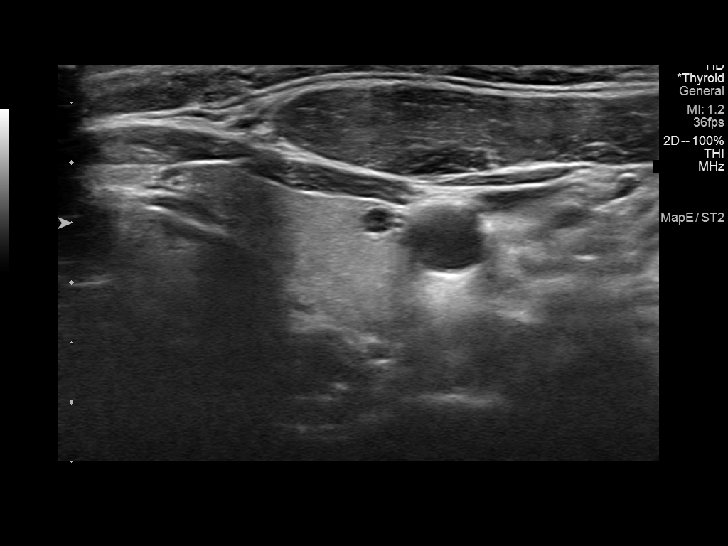

[14 of 25 positions shown; findings below may reference images not displayed]

FINDINGS: Parenchymal Echotexture: Mildly heterogenous

Isthmus: 0.5 cm

Right lobe: 4.1 x 1.1 x 1.3 cm

Left lobe: 4.2 x 1.1 x 1.1 cm

_________________________________________________________

Estimated total number of nodules >/= 1 cm: 0

Number of spongiform nodules >/=  2 cm not described below (TR1): 0

Number of mixed cystic and solid nodules >/= 1.5 cm not described
below (TR2): 0

_________________________________________________________

No discrete nodules of consequence are seen within the thyroid
gland.
IMPRESSION: Mildly heterogeneous and enlarged thyroid gland.

No discrete thyroid nodules of consequence.
